# Patient Record
Sex: Female | Born: 1950 | Race: White | Hispanic: No | Marital: Married | Smoking: Former smoker
Health system: Southern US, Community
[De-identification: ages and names within clinical notes are randomized; demographics above are authoritative.]

## PROBLEM LIST (undated history)

## (undated) DIAGNOSIS — K219 Gastro-esophageal reflux disease without esophagitis: Secondary | ICD-10-CM

## (undated) DIAGNOSIS — K649 Unspecified hemorrhoids: Secondary | ICD-10-CM

## (undated) DIAGNOSIS — M1990 Unspecified osteoarthritis, unspecified site: Secondary | ICD-10-CM

## (undated) DIAGNOSIS — R112 Nausea with vomiting, unspecified: Secondary | ICD-10-CM

## (undated) DIAGNOSIS — E7800 Pure hypercholesterolemia, unspecified: Secondary | ICD-10-CM

## (undated) DIAGNOSIS — Z9889 Other specified postprocedural states: Secondary | ICD-10-CM

## (undated) HISTORY — DX: Unspecified hemorrhoids: K64.9

---

## 1980-09-06 HISTORY — PX: AUGMENTATION MAMMAPLASTY: SUR837

## 1981-09-06 HISTORY — PX: TUBAL LIGATION: SHX77

## 2006-09-06 DIAGNOSIS — E7800 Pure hypercholesterolemia, unspecified: Secondary | ICD-10-CM

## 2006-09-06 HISTORY — DX: Pure hypercholesterolemia, unspecified: E78.00

## 2011-09-07 HISTORY — PX: SHOULDER ARTHROSCOPY W/ ROTATOR CUFF REPAIR: SHX2400

## 2012-05-03 ENCOUNTER — Ambulatory Visit: Payer: Self-pay | Admitting: Family Medicine

## 2012-05-22 ENCOUNTER — Ambulatory Visit: Payer: Self-pay | Admitting: Orthopedic Surgery

## 2012-07-03 ENCOUNTER — Ambulatory Visit: Payer: Self-pay | Admitting: Orthopedic Surgery

## 2012-07-03 LAB — URINALYSIS, COMPLETE
Bilirubin,UR: NEGATIVE
Glucose,UR: NEGATIVE mg/dL (ref 0–75)
Ketone: NEGATIVE
Leukocyte Esterase: NEGATIVE
Ph: 7 (ref 4.5–8.0)
Protein: NEGATIVE
RBC,UR: 2 /HPF (ref 0–5)
Squamous Epithelial: 12
WBC UR: 5 /HPF (ref 0–5)

## 2012-07-03 LAB — BASIC METABOLIC PANEL
BUN: 12 mg/dL (ref 7–18)
Calcium, Total: 8.4 mg/dL — ABNORMAL LOW (ref 8.5–10.1)
Co2: 28 mmol/L (ref 21–32)
EGFR (African American): >60
EGFR (Non-African Amer.): >60
Glucose: 103 mg/dL — ABNORMAL HIGH (ref 65–99)
Osmolality: 283 (ref 275–301)
Potassium: 3.7 mmol/L (ref 3.5–5.1)
Sodium: 142 mmol/L (ref 136–145)

## 2012-07-03 LAB — CBC
HGB: 13.6 g/dL (ref 12.0–16.0)
MCH: 30.9 pg (ref 26.0–34.0)
MCHC: 34.7 g/dL (ref 32.0–36.0)
MCV: 89 fL (ref 80–100)
Platelet: 200 10*3/uL (ref 150–440)
RDW: 12.8 % (ref 11.5–14.5)

## 2012-07-03 LAB — APTT: Activated PTT: 28 secs (ref 23.6–35.9)

## 2012-07-03 LAB — PROTIME-INR: INR: 0.9

## 2012-07-05 ENCOUNTER — Ambulatory Visit: Payer: Self-pay | Admitting: Gastroenterology

## 2012-07-11 ENCOUNTER — Ambulatory Visit: Payer: Self-pay | Admitting: Orthopedic Surgery

## 2013-05-04 ENCOUNTER — Ambulatory Visit: Payer: Self-pay | Admitting: Family Medicine

## 2014-07-30 ENCOUNTER — Ambulatory Visit: Payer: Self-pay | Admitting: Family Medicine

## 2014-08-16 ENCOUNTER — Ambulatory Visit: Payer: Self-pay | Admitting: Family Medicine

## 2014-11-08 ENCOUNTER — Ambulatory Visit: Payer: Self-pay | Admitting: Gastroenterology

## 2014-12-30 LAB — SURGICAL PATHOLOGY

## 2015-09-23 ENCOUNTER — Other Ambulatory Visit: Payer: Self-pay | Admitting: Family Medicine

## 2015-09-23 DIAGNOSIS — Z1231 Encounter for screening mammogram for malignant neoplasm of breast: Secondary | ICD-10-CM

## 2015-10-02 ENCOUNTER — Ambulatory Visit
Admission: RE | Admit: 2015-10-02 | Discharge: 2015-10-02 | Disposition: A | Discharge status: Home / Self Care | Payer: BC Managed Care – PPO | Source: Ambulatory Visit | Attending: Family Medicine | Admitting: Family Medicine

## 2015-10-02 DIAGNOSIS — Z1231 Encounter for screening mammogram for malignant neoplasm of breast: Secondary | ICD-10-CM | POA: Insufficient documentation

## 2015-10-02 DIAGNOSIS — Z9882 Breast implant status: Secondary | ICD-10-CM | POA: Diagnosis not present

## 2016-11-01 ENCOUNTER — Other Ambulatory Visit: Payer: Self-pay | Admitting: Family Medicine

## 2016-11-01 DIAGNOSIS — Z1231 Encounter for screening mammogram for malignant neoplasm of breast: Secondary | ICD-10-CM

## 2016-11-08 ENCOUNTER — Ambulatory Visit
Admission: RE | Admit: 2016-11-08 | Discharge: 2016-11-08 | Disposition: A | Discharge status: Home / Self Care | Payer: BC Managed Care – PPO | Source: Ambulatory Visit | Attending: Family Medicine | Admitting: Family Medicine

## 2016-11-08 ENCOUNTER — Other Ambulatory Visit: Payer: Self-pay | Admitting: Family Medicine

## 2016-11-08 DIAGNOSIS — Z1231 Encounter for screening mammogram for malignant neoplasm of breast: Secondary | ICD-10-CM | POA: Insufficient documentation

## 2017-04-15 DIAGNOSIS — M1612 Unilateral primary osteoarthritis, left hip: Secondary | ICD-10-CM | POA: Diagnosis not present

## 2017-04-20 ENCOUNTER — Encounter
Admission: RE | Admit: 2017-04-20 | Discharge: 2017-04-20 | Disposition: A | Discharge status: Home / Self Care | Payer: Medicare HMO | Source: Ambulatory Visit | Attending: Orthopedic Surgery | Admitting: Orthopedic Surgery

## 2017-04-20 DIAGNOSIS — Z01810 Encounter for preprocedural cardiovascular examination: Secondary | ICD-10-CM | POA: Insufficient documentation

## 2017-04-20 DIAGNOSIS — Z01812 Encounter for preprocedural laboratory examination: Secondary | ICD-10-CM | POA: Insufficient documentation

## 2017-04-20 HISTORY — DX: Gastro-esophageal reflux disease without esophagitis: K21.9

## 2017-04-20 HISTORY — DX: Nausea with vomiting, unspecified: R11.2

## 2017-04-20 HISTORY — DX: Other specified postprocedural states: Z98.890

## 2017-04-20 HISTORY — DX: Pure hypercholesterolemia, unspecified: E78.00

## 2017-04-20 HISTORY — DX: Unspecified osteoarthritis, unspecified site: M19.90

## 2017-04-20 LAB — PROTIME-INR
INR: 0.92
PROTHROMBIN TIME: 12.3 s (ref 11.4–15.2)

## 2017-04-20 LAB — URINALYSIS, ROUTINE W REFLEX MICROSCOPIC
BILIRUBIN URINE: NEGATIVE
GLUCOSE, UA: NEGATIVE mg/dL
Hgb urine dipstick: NEGATIVE
KETONES UR: NEGATIVE mg/dL
Leukocytes, UA: NEGATIVE
Nitrite: NEGATIVE
Protein, ur: NEGATIVE mg/dL
Specific Gravity, Urine: 1.011 (ref 1.005–1.030)
pH: 7 (ref 5.0–8.0)

## 2017-04-20 LAB — BASIC METABOLIC PANEL
Anion gap: 11 (ref 5–15)
BUN: 20 mg/dL (ref 6–20)
CALCIUM: 9.6 mg/dL (ref 8.9–10.3)
CO2: 28 mmol/L (ref 22–32)
CREATININE: 0.72 mg/dL (ref 0.44–1.00)
Chloride: 101 mmol/L (ref 101–111)
GFR calc non Af Amer: >60 mL/min (ref >60)
Glucose, Bld: 95 mg/dL (ref 65–99)
Potassium: 3.8 mmol/L (ref 3.5–5.1)
SODIUM: 140 mmol/L (ref 135–145)

## 2017-04-20 LAB — CBC
HCT: 39.4 % (ref 35.0–47.0)
HEMOGLOBIN: 13.6 g/dL (ref 12.0–16.0)
MCH: 30.6 pg (ref 26.0–34.0)
MCHC: 34.5 g/dL (ref 32.0–36.0)
MCV: 88.8 fL (ref 80.0–100.0)
PLATELETS: 184 10*3/uL (ref 150–440)
RBC: 4.44 MIL/uL (ref 3.80–5.20)
RDW: 13.1 % (ref 11.5–14.5)
WBC: 7.7 10*3/uL (ref 3.6–11.0)

## 2017-04-20 LAB — TYPE AND SCREEN
ABO/RH(D): O POS
Antibody Screen: NEGATIVE

## 2017-04-20 LAB — SURGICAL PCR SCREEN
MRSA, PCR: NEGATIVE
Staphylococcus aureus: NEGATIVE

## 2017-04-20 LAB — APTT: aPTT: 27 seconds (ref 24–36)

## 2017-04-20 LAB — SEDIMENTATION RATE: SED RATE: 10 mm/h (ref 0–30)

## 2017-04-20 NOTE — Pre-Procedure Instructions (Signed)
Called Dr Pernell DupreAdams regarding abnormal EKG.  Medical clearance requested.

## 2017-04-20 NOTE — Patient Instructions (Signed)
  Your procedure is scheduled ZO:XWRUEAVon:Tuesday August 28 , 2018. Report to Same Day Surgery. To find out your arrival time please call [phone] between 1PM - 3PM on Monday May 02, 2017.  Remember: Instructions that are not followed completely may result in serious medical risk, up to and including death, or upon the discretion of your surgeon and anesthesiologist your surgery may need to be rescheduled.    _x___ 1. Do not eat food or drink liquids after midnight. No gum chewing or hard candies.     _x___ 2. No Alcohol for 24 hours before or after surgery.   ____ 3. Bring all medications with you on the day of surgery if instructed.    __x__ 4. Notify your doctor if there is any change in your medical condition     (cold, fever, infections).    _____ 5. No smoking 24 hours prior to surgery.     Do not wear jewelry, make-up, hairpins, clips or nail polish.  Do not wear lotions, powders, or perfumes.   Do not shave 48 hours prior to surgery. Men may shave face and neck.  Do not bring valuables to the hospital.    [facility] is not responsible for any belongings or valuables.               Contacts, dentures or bridgework may not be worn into surgery.  Leave your suitcase in the car. After surgery it may be brought to your room.  For patients admitted to the hospital, discharge time is determined by your treatment team.   Patients discharged the day of surgery will not be allowed to drive home.    Please read over the following fact sheets that you were given:   [facility] Pre[facility]  __x__ Take these medicines the morning of surgery with A SIP OF WATER:    1. omeprazole (PRILOSEC) take one dose at bedtime the night prior to surgery and then the am of  surgery.  2. venlafaxine XR (EFFEXOR-XR)    ____ Fleet Enema (as directed)   __x__ Use CHG Soap as directed on instruction sheet  ____ Use inhalers on the day of surgery and bring to hospital day of surgery  ____  Stop metformin 2 days prior to surgery    ____ Take 1/2 of usual insulin dose the night before surgery and none on the morning of surgery.   ____ Stop aspirin on Tuesday April 26, 2017.  ____ Stop Anti-inflammatories such as Advil, Aleve, Ibuprofen, meloxicam (MOBIC) , Motrin, Naproxen, Naprosyn,  Goodies powders or aspirin products. OK to take Tylenol.   ____ Stop supplements until after surgery.    ____ Bring C-Pap to the hospital.

## 2017-04-20 NOTE — Pre-Procedure Instructions (Signed)
EKG / MEDICAL CLEARANCE REQUEST CALLED AND FAXED TO TAYLOR AT DR [facility]. [facility]

## 2017-04-20 NOTE — Pre-Procedure Instructions (Signed)
Spoke with Baird Lyonsasey at Dr. Rosita KeaMenz office regarding joint class and that pt attended joint class last year with her husband when he had his hip replaced Sept 2017. Baird LyonsCasey said pt does not need to go to joint class today.

## 2017-04-20 NOTE — Pre-Procedure Instructions (Signed)
Progress Notes - in this encounter  Table of Contents for Progress Notes Marlena ClipperMenz, Michael Joseph, MD - 04/15/2017 8:15 AM EDT Margaretmary Eddyrowder, Lindsay, CMA - 04/15/2017 8:15 AM EDT   Marlena ClipperMenz, Michael Joseph, MD - 04/15/2017 8:15 AM EDT Formatting of this note may be different from the original.  Chief Complaint  Patient presents with  . Follow-up  L Hip Pain.   History of the Present Illness: Geryl CouncilmanJanice Anfinson is a 66 y.o. female here for follow-up of bilateral hip osteoarthritis that was seen on lumbar x-ray in [place]. She was previously seen by Dedra Skeensodd Mundy, PA and he referred her to physical therapy for her back pain. The patient comes in today to discuss possible total hip replacement. Her left hip is worse.   The patient explains that her pain begins in the middle of her left buttock. She occasionally limps for several days due to pain. She has pain at night that radiates to her knee. Her pain is keeping her from her daily activities and recreational activities.  I have reviewed past medical, surgical, social and family history, and allergies as documented in the EMR.  Past Medical History: Past Medical History:  Diagnosis Date  . External hemorrhoids 11/08/2014  . Hyperlipidemia, unspecified  . Internal hemorrhoids 11/08/2014  . Tubular adenoma of colon, unspecified 11/08/2014   Past Surgical History: Past Surgical History:  Procedure Laterality Date  . Breast augmentation 1981  . COLONOSCOPY 11/08/2014  Tubular adenoma/Repeat 5262yrs/MGR  . right rotator cuff surgery 2013   Past Family History: Family History  Problem Relation Age of Onset  . Alzheimer's disease Mother  . Myocardial Infarction (Heart attack) Father  fatal  . Hyperlipidemia (Elevated cholesterol) Father   Medications: Current Outpatient Prescriptions Ordered in Epic  Medication Sig Dispense Refill  . AMABELZ 1-0.5 mg tablet TAKE 1 TABLET ONCE DAILY 84 tablet 1  . aspirin 81 MG chewable tablet Take 81 mg by mouth once daily.    Marland Kitchen. estradiol-norethindrone (AMABELZ) 1-0.5 mg tablet Take 1 tablet by mouth once daily. 84 tablet 1  . fexofenadine (ALLEGRA) 180 MG tablet Take 180 mg by mouth once daily.  . meloxicam (MOBIC) 7.5 MG tablet Take 7.5 mg by mouth 2 (two) times daily. 0  . multivitamin tablet Take 1 tablet by mouth once daily.  Marland Kitchen. omeprazole (PRILOSEC) 40 MG DR capsule Take 1 capsule (40 mg total) by mouth once daily. 90 capsule 1  . peg-electrolyte (NULYTELY) solution Take as directed for colon prep. 4000 mL 0  . pravastatin (PRAVACHOL) 40 MG tablet Take 1 tablet (40 mg total) by mouth nightly. 90 tablet 1  . tiZANidine (ZANAFLEX) 4 MG tablet Take 1 tablet (4 mg total) by mouth nightly as needed. 90 tablet 1  . venlafaxine (EFFEXOR-XR) 37.5 MG XR capsule Take 1 capsule (37.5 mg total) by mouth once daily. 90 capsule 1  . VITAMIN B COMPLEX (B COMPLEX 1 ORAL) Take 1 tablet by mouth once daily.   No current Epic-ordered facility-administered medications on file.   Allergies: No Known Allergies   Body mass index is 28.72 kg/m.  Review of Systems: A comprehensive 14 point ROS was performed, reviewed, and the pertinent orthopaedic findings are documented in the HPI.  Vitals:  04/15/17 0817  BP: 112/70   General Physical Examination:  General/Constitutional: No apparent distress: well-nourished and well developed. Eyes: Pupils equal, round with synchronous movement. Lungs: Clear to auscultation HEENT: Normal Vascular: No edema, swelling or tenderness, except as noted in detailed exam. Cardiac: Heart  rate and rhythm is regular. Integumentary: No impressive skin lesions present, except as noted in detailed exam. Neuro/Psych: Normal mood and affect, oriented to person, place and time.  Musculoskeletal Examination: On exam, the patient has 5 degrees of internal rotation on the right with external rotation 40 degrees. The left hip has approximately 5 degrees of internal rotation with 30 degrees of external  rotation with pain.  Radiographs: AP and lateral x-rays of the left hip were ordered and personally reviewed today. These show progression from a prior x-ray from 4 months ago with cyst formation in the acetabulum. There is bone loss. Subchondral cyst formation is appreciated in the femoral head. She has complete loss of joint space and slight collapse of the femoral head.  X-ray Impression: Significant progression of osteoarthritis of the left hip with subluxation and complete loss of joint space with extensive osteophytes medially  Assessment: ICD-10-CM ICD-9-CM  1. Primary osteoarthritis of left hip M16.12 715.15   Plan:  We discussed her condition and treatment options today. I recommend she have total hip replacement on the left. She has undergone physical therapy. She has taken anti-inflammatories. She has significant progression at this time. Her husband had anterior approach hip replacement last year and did well. She hopes to do just as well.  Scribe Attestation: I, Georgia Lopes, am acting as scribe for [employer], MD

## 2017-04-21 LAB — URINE CULTURE: Culture: NO GROWTH

## 2017-04-26 ENCOUNTER — Encounter: Payer: Self-pay | Admitting: Nurse Practitioner

## 2017-04-26 ENCOUNTER — Ambulatory Visit (INDEPENDENT_AMBULATORY_CARE_PROVIDER_SITE_OTHER): Payer: Medicare HMO | Admitting: Nurse Practitioner

## 2017-04-26 VITALS — BP 128/78 | HR 84 | Ht 62.0 in | Wt 154.8 lb

## 2017-04-26 DIAGNOSIS — M159 Polyosteoarthritis, unspecified: Secondary | ICD-10-CM | POA: Diagnosis not present

## 2017-04-26 DIAGNOSIS — E782 Mixed hyperlipidemia: Secondary | ICD-10-CM | POA: Diagnosis not present

## 2017-04-26 DIAGNOSIS — Z01810 Encounter for preprocedural cardiovascular examination: Secondary | ICD-10-CM

## 2017-04-26 NOTE — Progress Notes (Signed)
Cardiology Clinic Note   Patient Name: Briana Morris Date of Encounter: 04/26/2017  Primary Care Provider:  [name], MD Primary Cardiologist:  New  Patient Profile    66 y/o ? with a h/o HL, GERD, and OA, who presents for preop eval pending L total hip arthroplasty.  Past Medical History    Past Medical History:  Diagnosis Date  . Arthritis   . GERD (gastroesophageal reflux disease)   . Hemorrhoids    a. Internal/External.  . Hypercholesteremia 2008  . PONV (postoperative nausea and vomiting)    nausea no vomiting  . Tubular adenoma of colon    a. 11/2014 s/p resection.   Past Surgical History:  Procedure Laterality Date  . AUGMENTATION MAMMAPLASTY Bilateral 1982  . SHOULDER ARTHROSCOPY W/ ROTATOR CUFF REPAIR Right 2013    Allergies  No Known Allergies  History of Present Illness    66 y/o ? w/o prior cardiac hx.  She does have a h/o HL, OA, GERD, hemorrhoids, colon adenoma s/p resection, and tobacco abuse, quitting in 2001.  She lives locally with her husband and is generally pretty active.  They walk regularly and up until March of this year, they were going to the gym for both aerobic activities along with some light wt training.  Over the past 6 months, she has had progressive left hip and lower back pain.  She was referred to ortho and initially referred for PT for lumbar DDD and bilat hip DJD.  Unfortunately, left hip symptoms progressed and activity tolerance declined 2/2 left hip pain.  F/u imaging has shown progression of left hip OA and decision has been made to pursue L THA - tentatively scheduled for 8/28.   From a cardiac standpoint, pt has no prior h/o chest pain or DOE.  As noted, she was previously very active prior to progressive hip pain earlier this year and even now, she is able to walk at a slow pace without cardiovascular limitations, though left hip pain causes her to limp and may cause her to have to rest.  She denies palpitations, pnd,  orthopnea, n, v, dizziness, syncope, edema, weight gain, or early satiety.   Home Medications    Prior to Admission medications   Medication Sig Start Date End Date Taking? Authorizing Provider  aspirin EC 81 MG tablet Take 81 mg by mouth daily.   Yes [provider]  estradiol-norethindrone (AMABELZ) 1-0.5 MG tablet Take 1 tablet by mouth daily.   Yes [provider]  Ibuprofen-Diphenhydramine Cit (ADVIL PM) 200-38 MG TABS Take 1 tablet by mouth at bedtime.   Yes [provider]  meloxicam (MOBIC) 7.5 MG tablet Take 7.5 mg by mouth 2 (two) times daily.   Yes [provider]  Multiple Vitamin (MULTIVITAMIN WITH MINERALS) TABS tablet Take 1 tablet by mouth daily.   Yes [provider]  omeprazole (PRILOSEC) 40 MG capsule Take 40 mg by mouth at bedtime.   Yes [provider]  pravastatin (PRAVACHOL) 40 MG tablet Take 40 mg by mouth at bedtime.   Yes [provider]  Tetrahydroz-Glyc-Hyprom-PEG (VISINE MAXIMUM REDNESS RELIEF OP) Place 1 drop into both eyes daily as needed (red eyes).   Yes [provider]  tiZANidine (ZANAFLEX) 4 MG tablet Take 4 mg by mouth at bedtime as needed for muscle spasms.   Yes [provider]  venlafaxine XR (EFFEXOR-XR) 37.5 MG 24 hr capsule Take 37.5 mg by mouth daily.   Yes [provider]  Family History    Family History  Problem Relation Age of Onset  . Alzheimer's disease Mother   . Hyperlipidemia Father   . Heart attack Father   . Heart attack Brother        MI @ age 33  . Cancer Brother        throat- resolved  . Breast cancer Neg Hx     Social History    Social History   Social History  . Marital status: Divorced    Spouse name: N/A  . Number of children: N/A  . Years of education: N/A   Occupational History  . Not on file.   Social History Main Topics  . Smoking status: Former Smoker    Packs/day: 1.00    Years: 28.00    Types: Cigarettes     Quit date: 2001  . Smokeless tobacco: Never Used  . Alcohol use 2.4 oz/week    4 Glasses of wine per week     Comment: 1-2 glasses of wine 3-4 nights/wk  . Drug use: No  . Sexual activity: Not on file   Other Topics Concern  . Not on file   Social History Narrative   Lives in [place] with husband.  Active.  Was exercising regularly until hip pain worsened 11/2016.     Review of Systems    General:  No chills, fever, night sweats or weight changes.  Cardiovascular:  No chest pain, dyspnea on exertion, edema, orthopnea, palpitations, paroxysmal nocturnal dyspnea. Dermatological: No rash, lesions/masses Respiratory: No cough, dyspnea Urologic: No hematuria, dysuria Abdominal:   No nausea, vomiting, diarrhea, bright red blood per rectum, melena, or hematemesis Neurologic:  No visual changes, wkns, changes in mental status. MSK: +++ left hip pain All other systems reviewed and are otherwise negative except as noted above.  Physical Exam    VS:  BP 128/78 (BP Location: Right Arm, Patient Position: Sitting, Cuff Size: Normal)   Pulse 84   Ht 5\' 2"  (1.575 m)   Wt 154 lb 12 oz (70.2 kg)   SpO2 96%   BMI 28.30 kg/m  , BMI Body mass index is 28.3 kg/m. GEN: Well nourished, well developed, in no acute distress.  HEENT: normal.  Neck: Supple, no JVD, carotid bruits, or masses. Cardiac: RRR, no murmurs, rubs, or gallops. No clubbing, cyanosis, edema.  Radials/DP/PT 2+ and equal bilaterally.  Respiratory:  Respirations regular and unlabored, clear to auscultation bilaterally. GI: Soft, nontender, nondistended, BS + x 4. MS: no deformity or atrophy. Skin: warm and dry, no rash. Neuro:  Strength and sensation are intact. Psych: Normal affect.  Accessory Clinical Findings    ECG - 04/20/2017: RSR, 68, leftward axis, no acute st/t changes.  Assessment & Plan   1.  Preoperative cardiovascular examination:  Pt w/ a prior h/o HL on statin therapy and remote tobacco abuse.  No prior  cardiac history.  She was previously very active and exercising regularly up until ~ 11/2016 when she began to experience progressive left hip pain.  She continues to walk regularly with her husband and has no prior h/o chest pain or dyspnea on exertion.  RCRI risk of a major perioperative cardiac event is 0.4%.  In that setting, she may proceed to surgery without prior cardiovascular risk stratification or ischemic evaluation.  Cont statin therapy throughout the perioperative period and feel free to contact our team if she develops any postoperative cardiology needs.  2.  HL:  Followed by PCP.  LDL 160  in Feb. 2018.  On statin therapy and would recommend continuation throughout perioperative period.  3.  GERD:  Asymptomatic and well controlled on PPI therapy.  4.  OA:  Pending L THA.  See #1.   5. Disposition:  F/u with cardiology prn.  Nicolasa Ducking, NP 04/26/2017, 3:26 PM

## 2017-04-26 NOTE — Patient Instructions (Signed)
Follow-Up: Your physician recommends that you schedule a follow-up appointment as needed.   It was a pleasure seeing you today here in the office. Please do not hesitate to give us a call back if you have any further questions. [phone]  Pamela A. RN, BSN    

## 2017-04-29 NOTE — Pre-Procedure Instructions (Signed)
Briana Morris  04/26/2017 2:30 PM  Office Visit  MRN:  [id number]  Description: Female DOB: 12-Mar-1951 Provider: Ok Anis, NP Department: [facility]  Diagnoses    Codes Comments  Preop cardiovascular exam - Primary Z01.810   Mixed hyperlipidemia  E78.2   Osteoarthritis of multiple joints, unspecified osteoarthritis type  M15.9        Referring Provider   [name], MD      Reason for Visit   other pre op clearance, surgery on 8/28 per Dr. Burnett Sheng. Pt Reviewed meds with pt verbally.  Reason for Visit History    Vital Signs - Last Recorded  Most recent update: 04/26/2017 2:41 PM by Deatra Ina L, LPN  BP  409/81 (BP Location: Right Arm, Patient Position: Sitting, Cuff Size: Normal)     Pulse  84     Ht  5\' 2"  (1.575 m)     Wt  154 lb 12 oz (70.2 kg)     SpO2  96%      BMI  28.30 kg/m      Vitals History  Encounter Notes   Progress Notes by Ok Anis, NP at 04/26/2017 2:30 PM  Version 1 of 1  Author: Ok Anis, NP Service: Cardiology Author Type: Nurse Practitioner  Filed: 04/26/2017 3:45 PM Encounter Date: 04/26/2017 Status: Signed  Editor: Ok Anis, NP (Nurse Practitioner)      Cardiology Clinic Note   Patient Name: Briana Morris Date of Encounter: 04/26/2017  Primary Care Provider:  [name], MD Primary Cardiologist:  New  Patient Profile    66 y/o ? with a h/o HL, GERD, and OA, who presents for preop eval pending L total hip arthroplasty.  Past Medical History        Past Medical History:  Diagnosis Date  . Arthritis   . GERD (gastroesophageal reflux disease)   . Hemorrhoids    a. Internal/External.  . Hypercholesteremia 2008  . PONV (postoperative nausea and vomiting)    nausea no vomiting  . Tubular adenoma of colon    a. 11/2014 s/p resection.        Past Surgical History:  Procedure Laterality Date  . AUGMENTATION MAMMAPLASTY  Bilateral 1982  . SHOULDER ARTHROSCOPY W/ ROTATOR CUFF REPAIR Right 2013    Allergies  No Known Allergies  History of Present Illness    66 y/o ? w/o prior cardiac hx.  She does have a h/o HL, OA, GERD, hemorrhoids, colon adenoma s/p resection, and tobacco abuse, quitting in 2001.  She lives locally with her husband and is generally pretty active.  They walk regularly and up until March of this year, they were going to the gym for both aerobic activities along with some light wt training.  Over the past 6 months, she has had progressive left hip and lower back pain.  She was referred to ortho and initially referred for PT for lumbar DDD and bilat hip DJD.  Unfortunately, left hip symptoms progressed and activity tolerance declined 2/2 left hip pain.  F/u imaging has shown progression of left hip OA and decision has been made to pursue L THA - tentatively scheduled for 8/28.   From a cardiac standpoint, pt has no prior h/o chest pain or DOE.  As noted, she was previously very active prior to progressive hip pain earlier this year and even now, she is able to walk at a slow pace without cardiovascular limitations, though left hip pain causes her to  limp and may cause her to have to rest.  She denies palpitations, pnd, orthopnea, n, v, dizziness, syncope, edema, weight gain, or early satiety.   Home Medications           Prior to Admission medications   Medication Sig Start Date End Date Taking? Authorizing Provider  aspirin EC 81 MG tablet Take 81 mg by mouth daily.   Yes [provider]  estradiol-norethindrone (AMABELZ) 1-0.5 MG tablet Take 1 tablet by mouth daily.   Yes [provider]  Ibuprofen-Diphenhydramine Cit (ADVIL PM) 200-38 MG TABS Take 1 tablet by mouth at bedtime.   Yes [provider]  meloxicam (MOBIC) 7.5 MG tablet Take 7.5 mg by mouth 2 (two) times daily.   Yes [provider]  Multiple Vitamin (MULTIVITAMIN WITH MINERALS) TABS  tablet Take 1 tablet by mouth daily.   Yes [provider]  omeprazole (PRILOSEC) 40 MG capsule Take 40 mg by mouth at bedtime.   Yes [provider]  pravastatin (PRAVACHOL) 40 MG tablet Take 40 mg by mouth at bedtime.   Yes [provider]  Tetrahydroz-Glyc-Hyprom-PEG (VISINE MAXIMUM REDNESS RELIEF OP) Place 1 drop into both eyes daily as needed (red eyes).   Yes [provider]  tiZANidine (ZANAFLEX) 4 MG tablet Take 4 mg by mouth at bedtime as needed for muscle spasms.   Yes [provider]  venlafaxine XR (EFFEXOR-XR) 37.5 MG 24 hr capsule Take 37.5 mg by mouth daily.   Yes [provider]    Family History         Family History  Problem Relation Age of Onset  . Alzheimer's disease Mother   . Hyperlipidemia Father   . Heart attack Father   . Heart attack Brother        MI @ age 27  . Cancer Brother        throat- resolved  . Breast cancer Neg Hx     Social History    Social History        Social History  . Marital status: Divorced    Spouse name: N/A  . Number of children: N/A  . Years of education: N/A      Occupational History  . Not on file.         Social History Main Topics  . Smoking status: Former Smoker    Packs/day: 1.00    Years: 28.00    Types: Cigarettes    Quit date: 2001  . Smokeless tobacco: Never Used  . Alcohol use 2.4 oz/week    4 Glasses of wine per week     Comment: 1-2 glasses of wine 3-4 nights/wk  . Drug use: No  . Sexual activity: Not on file       Other Topics Concern  . Not on file      Social History Narrative   Lives in [place] with husband.  Active.  Was exercising regularly until hip pain worsened 11/2016.     Review of Systems    General:  No chills, fever, night sweats or weight changes.  Cardiovascular:  No chest pain, dyspnea on exertion, edema, orthopnea, palpitations, paroxysmal nocturnal  dyspnea. Dermatological: No rash, lesions/masses Respiratory: No cough, dyspnea Urologic: No hematuria, dysuria Abdominal:   No nausea, vomiting, diarrhea, bright red blood per rectum, melena, or hematemesis Neurologic:  No visual changes, wkns, changes in mental status. MSK: +++ left hip pain All other systems reviewed and are otherwise negative except as noted  above.  Physical Exam    VS:  BP 128/78 (BP Location: Right Arm, Patient Position: Sitting, Cuff Size: Normal)   Pulse 84   Ht 5\' 2"  (1.575 m)   Wt 154 lb 12 oz (70.2 kg)   SpO2 96%   BMI 28.30 kg/m  , BMI Body mass index is 28.3 kg/m. GEN: Well nourished, well developed, in no acute distress.  HEENT: normal.  Neck: Supple, no JVD, carotid bruits, or masses. Cardiac: RRR, no murmurs, rubs, or gallops. No clubbing, cyanosis, edema.  Radials/DP/PT 2+ and equal bilaterally.  Respiratory:  Respirations regular and unlabored, clear to auscultation bilaterally. GI: Soft, nontender, nondistended, BS + x 4. MS: no deformity or atrophy. Skin: warm and dry, no rash. Neuro:  Strength and sensation are intact. Psych: Normal affect.  Accessory Clinical Findings    ECG - 04/20/2017: RSR, 68, leftward axis, no acute st/t changes.  Assessment & Plan   1.  Preoperative cardiovascular examination:  Pt w/ a prior h/o HL on statin therapy and remote tobacco abuse.  No prior cardiac history.  She was previously very active and exercising regularly up until ~ 11/2016 when she began to experience progressive left hip pain.  She continues to walk regularly with her husband and has no prior h/o chest pain or dyspnea on exertion.  RCRI risk of a major perioperative cardiac event is 0.4%.  In that setting, she may proceed to surgery without prior cardiovascular risk stratification or ischemic evaluation.  Cont statin therapy throughout the perioperative period and feel free to contact our team if she develops any postoperative cardiology  needs.  2.  HL:  Followed by PCP.  LDL 160 in Feb. 2018.  On statin therapy and would recommend continuation throughout perioperative period.  3.  GERD:  Asymptomatic and well controlled on PPI therapy.  4.  OA:  Pending L THA.  See #1.   5. Disposition:  F/u with cardiology prn.  Nicolasa Ducking, NP 04/26/2017, 3:26 PM      Progress Notes by Ok Anis, NP at 04/26/2017 2:30 PM  Version 1 of 1  Author: Ok Anis, NP Service: Cardiology Author Type: Nurse Practitioner  Filed: 04/26/2017 3:45 PM Encounter Date: 04/26/2017 Status: Signed  Editor: Ok Anis, NP (Nurse Practitioner)         Patient Instructions by Bryna Colander, RN at 04/26/2017 2:30 PM  Version 1 of 1  Author: Bryna Colander, RN Service: (none) Author Type: Registered Nurse  Filed: 04/26/2017 3:07 PM Encounter Date: 04/26/2017 Status: Signed  Editor: Bryna Colander, RN (Registered Nurse)    Follow-Up: Your physician recommends that you schedule a follow-up appointment as needed.    It was a pleasure seeing you today here in the office. Please do not hesitate to give [place] a call back if you have any further questions. [phone]  [address] Cellar RN, BSN        Follow-up and Disposition   Return if symptoms worsen or fail to improve.  Routing History  Follow-up and Disposition History

## 2017-05-03 ENCOUNTER — Inpatient Hospital Stay: Payer: Medicare HMO

## 2017-05-03 ENCOUNTER — Inpatient Hospital Stay: Payer: Medicare HMO | Admitting: Certified Registered Nurse Anesthetist

## 2017-05-03 ENCOUNTER — Encounter: Payer: Self-pay | Admitting: *Deleted

## 2017-05-03 ENCOUNTER — Inpatient Hospital Stay
Admission: RE | Admit: 2017-05-03 | Discharge: 2017-05-05 | DRG: 470 | Disposition: A | Discharge status: Home Health | Payer: Medicare HMO | Source: Ambulatory Visit | Attending: Orthopedic Surgery | Admitting: Orthopedic Surgery

## 2017-05-03 ENCOUNTER — Encounter
Admission: RE | Disposition: A | Discharge status: Home Health | Payer: Self-pay | Source: Ambulatory Visit | Attending: Orthopedic Surgery

## 2017-05-03 DIAGNOSIS — K219 Gastro-esophageal reflux disease without esophagitis: Secondary | ICD-10-CM | POA: Diagnosis present

## 2017-05-03 DIAGNOSIS — Z87891 Personal history of nicotine dependence: Secondary | ICD-10-CM

## 2017-05-03 DIAGNOSIS — M1612 Unilateral primary osteoarthritis, left hip: Secondary | ICD-10-CM | POA: Diagnosis not present

## 2017-05-03 DIAGNOSIS — E7800 Pure hypercholesterolemia, unspecified: Secondary | ICD-10-CM | POA: Diagnosis not present

## 2017-05-03 DIAGNOSIS — M1712 Unilateral primary osteoarthritis, left knee: Secondary | ICD-10-CM | POA: Diagnosis not present

## 2017-05-03 DIAGNOSIS — Z471 Aftercare following joint replacement surgery: Secondary | ICD-10-CM | POA: Diagnosis not present

## 2017-05-03 DIAGNOSIS — Z96642 Presence of left artificial hip joint: Secondary | ICD-10-CM | POA: Diagnosis not present

## 2017-05-03 DIAGNOSIS — G8918 Other acute postprocedural pain: Secondary | ICD-10-CM

## 2017-05-03 DIAGNOSIS — Z419 Encounter for procedure for purposes other than remedying health state, unspecified: Secondary | ICD-10-CM

## 2017-05-03 HISTORY — PX: TOTAL HIP ARTHROPLASTY: SHX124

## 2017-05-03 LAB — CBC
HCT: 33.5 % — ABNORMAL LOW (ref 35.0–47.0)
Hemoglobin: 11.6 g/dL — ABNORMAL LOW (ref 12.0–16.0)
MCH: 30.6 pg (ref 26.0–34.0)
MCHC: 34.5 g/dL (ref 32.0–36.0)
MCV: 88.8 fL (ref 80.0–100.0)
PLATELETS: 169 10*3/uL (ref 150–440)
RBC: 3.78 MIL/uL — ABNORMAL LOW (ref 3.80–5.20)
RDW: 12.7 % (ref 11.5–14.5)
WBC: 17.6 10*3/uL — AB (ref 3.6–11.0)

## 2017-05-03 LAB — CREATININE, SERUM: Creatinine, Ser: 0.6 mg/dL (ref 0.44–1.00)

## 2017-05-03 LAB — ABO/RH: ABO/RH(D): O POS

## 2017-05-03 SURGERY — ARTHROPLASTY, HIP, TOTAL, ANTERIOR APPROACH
Anesthesia: Spinal | Laterality: Left | Wound class: Clean

## 2017-05-03 MED ORDER — PRAVASTATIN SODIUM 20 MG PO TABS
40.0000 mg | ORAL_TABLET | Freq: Every day | ORAL | Status: DC
  Administered 2017-05-03 – 2017-05-04 (×2): 40 mg via ORAL
  Filled 2017-05-03 (×2): qty 2

## 2017-05-03 MED ORDER — PHENYLEPHRINE HCL 10 MG/ML IJ SOLN
INTRAMUSCULAR | Status: DC | PRN
  Administered 2017-05-03 (×2): 100 ug via INTRAVENOUS
  Administered 2017-05-03: 200 ug via INTRAVENOUS
  Administered 2017-05-03 (×4): 100 ug via INTRAVENOUS
  Administered 2017-05-03: 200 ug via INTRAVENOUS

## 2017-05-03 MED ORDER — PROPOFOL 10 MG/ML IV BOLUS
INTRAVENOUS | Status: DC | PRN
  Administered 2017-05-03: 20 mg via INTRAVENOUS

## 2017-05-03 MED ORDER — FENTANYL CITRATE (PF) 100 MCG/2ML IJ SOLN: 25.0000 ug | INTRAMUSCULAR | Status: DC | PRN

## 2017-05-03 MED ORDER — ACETAMINOPHEN 650 MG RE SUPP: 650.0000 mg | Freq: Four times a day (QID) | RECTAL | Status: DC | PRN

## 2017-05-03 MED ORDER — ADULT MULTIVITAMIN W/MINERALS CH
1.0000 | ORAL_TABLET | Freq: Every day | ORAL | Status: DC
  Administered 2017-05-05: 1 via ORAL
  Filled 2017-05-03 (×2): qty 1

## 2017-05-03 MED ORDER — MIDAZOLAM HCL 5 MG/5ML IJ SOLN
INTRAMUSCULAR | Status: DC | PRN
  Administered 2017-05-03: 2 mg via INTRAVENOUS

## 2017-05-03 MED ORDER — ASPIRIN EC 81 MG PO TBEC
81.0000 mg | DELAYED_RELEASE_TABLET | Freq: Every day | ORAL | Status: DC
  Administered 2017-05-05: 81 mg via ORAL
  Filled 2017-05-03 (×2): qty 1

## 2017-05-03 MED ORDER — METHOCARBAMOL 1000 MG/10ML IJ SOLN
500.0000 mg | Freq: Four times a day (QID) | INTRAMUSCULAR | Status: DC | PRN
  Filled 2017-05-03: qty 5

## 2017-05-03 MED ORDER — ONDANSETRON HCL 4 MG PO TABS: 4.0000 mg | ORAL_TABLET | Freq: Four times a day (QID) | ORAL | Status: DC | PRN

## 2017-05-03 MED ORDER — METOCLOPRAMIDE HCL 5 MG/ML IJ SOLN
5.0000 mg | Freq: Three times a day (TID) | INTRAMUSCULAR | Status: DC | PRN
  Administered 2017-05-04: 10 mg via INTRAVENOUS
  Filled 2017-05-03: qty 2

## 2017-05-03 MED ORDER — PROPOFOL 500 MG/50ML IV EMUL
INTRAVENOUS | Status: DC | PRN
  Administered 2017-05-03: 50 ug/kg/min via INTRAVENOUS

## 2017-05-03 MED ORDER — MAGNESIUM CITRATE PO SOLN
1.0000 | Freq: Once | ORAL | Status: DC | PRN
  Filled 2017-05-03: qty 296

## 2017-05-03 MED ORDER — PROPOFOL 500 MG/50ML IV EMUL
INTRAVENOUS | Status: AC
  Filled 2017-05-03: qty 50

## 2017-05-03 MED ORDER — TIZANIDINE HCL 4 MG PO TABS
4.0000 mg | ORAL_TABLET | Freq: Every evening | ORAL | Status: DC | PRN
  Filled 2017-05-03: qty 1

## 2017-05-03 MED ORDER — BUPIVACAINE-EPINEPHRINE (PF) 0.25% -1:200000 IJ SOLN
INTRAMUSCULAR | Status: AC
  Filled 2017-05-03: qty 30

## 2017-05-03 MED ORDER — ALUM & MAG HYDROXIDE-SIMETH 200-200-20 MG/5ML PO SUSP: 30.0000 mL | ORAL | Status: DC | PRN

## 2017-05-03 MED ORDER — SODIUM CHLORIDE 0.9 % IV BOLUS (SEPSIS): 500.0000 mL | Freq: Once | INTRAVENOUS | Status: DC

## 2017-05-03 MED ORDER — MAGNESIUM HYDROXIDE 400 MG/5ML PO SUSP
30.0000 mL | Freq: Every day | ORAL | Status: DC | PRN
  Administered 2017-05-04: 30 mL via ORAL
  Filled 2017-05-03: qty 30

## 2017-05-03 MED ORDER — OXYCODONE HCL 5 MG PO TABS
5.0000 mg | ORAL_TABLET | ORAL | Status: DC | PRN
  Administered 2017-05-03 (×2): 5 mg via ORAL
  Administered 2017-05-04: 10 mg via ORAL
  Administered 2017-05-04: 5 mg via ORAL
  Filled 2017-05-03: qty 2
  Filled 2017-05-03 (×3): qty 1

## 2017-05-03 MED ORDER — PROMETHAZINE HCL 25 MG/ML IJ SOLN: 6.2500 mg | INTRAMUSCULAR | Status: DC | PRN

## 2017-05-03 MED ORDER — NAPHAZOLINE-GLYCERIN 0.012-0.2 % OP SOLN
1.0000 [drp] | Freq: Four times a day (QID) | OPHTHALMIC | Status: DC | PRN
  Filled 2017-05-03: qty 15

## 2017-05-03 MED ORDER — CEFAZOLIN SODIUM-DEXTROSE 2-4 GM/100ML-% IV SOLN
2.0000 g | Freq: Once | INTRAVENOUS | Status: AC
  Administered 2017-05-03: 2 g via INTRAVENOUS

## 2017-05-03 MED ORDER — KETOROLAC TROMETHAMINE 15 MG/ML IJ SOLN
15.0000 mg | Freq: Once | INTRAMUSCULAR | Status: AC
  Administered 2017-05-03: 15 mg via INTRAVENOUS
  Filled 2017-05-03: qty 1

## 2017-05-03 MED ORDER — LACTATED RINGERS IV SOLN
INTRAVENOUS | Status: DC
  Administered 2017-05-03 (×2): via INTRAVENOUS

## 2017-05-03 MED ORDER — VENLAFAXINE HCL ER 37.5 MG PO CP24
37.5000 mg | ORAL_CAPSULE | Freq: Every day | ORAL | Status: DC
  Administered 2017-05-04 – 2017-05-05 (×2): 37.5 mg via ORAL
  Filled 2017-05-03 (×2): qty 1

## 2017-05-03 MED ORDER — SODIUM CHLORIDE 0.9 % IV SOLN
INTRAVENOUS | Status: DC | PRN
  Administered 2017-05-03: 1000 mg via INTRAVENOUS

## 2017-05-03 MED ORDER — ZOLPIDEM TARTRATE 5 MG PO TABS: 5.0000 mg | ORAL_TABLET | Freq: Every evening | ORAL | Status: DC | PRN

## 2017-05-03 MED ORDER — SODIUM CHLORIDE 0.9 % IV SOLN
INTRAVENOUS | Status: DC
  Administered 2017-05-03 – 2017-05-04 (×2): via INTRAVENOUS

## 2017-05-03 MED ORDER — TRANEXAMIC ACID 1000 MG/10ML IV SOLN
1000.0000 mg | INTRAVENOUS | Status: DC
  Filled 2017-05-03: qty 10

## 2017-05-03 MED ORDER — PROPOFOL 10 MG/ML IV BOLUS
INTRAVENOUS | Status: AC
  Filled 2017-05-03: qty 20

## 2017-05-03 MED ORDER — METHOCARBAMOL 500 MG PO TABS
500.0000 mg | ORAL_TABLET | Freq: Four times a day (QID) | ORAL | Status: DC | PRN
  Administered 2017-05-04 – 2017-05-05 (×3): 500 mg via ORAL
  Filled 2017-05-03 (×3): qty 1

## 2017-05-03 MED ORDER — BUPIVACAINE HCL (PF) 0.5 % IJ SOLN
INTRAMUSCULAR | Status: DC | PRN
  Administered 2017-05-03: 3 mL

## 2017-05-03 MED ORDER — OXYCODONE HCL 5 MG/5ML PO SOLN
5.0000 mg | Freq: Once | ORAL | Status: DC | PRN
Start: 2017-05-03 — End: 2017-05-03

## 2017-05-03 MED ORDER — DIPHENHYDRAMINE HCL 12.5 MG/5ML PO ELIX: 12.5000 mg | ORAL_SOLUTION | ORAL | Status: DC | PRN

## 2017-05-03 MED ORDER — MORPHINE SULFATE (PF) 2 MG/ML IV SOLN
2.0000 mg | INTRAVENOUS | Status: DC | PRN
  Administered 2017-05-03 – 2017-05-04 (×3): 2 mg via INTRAVENOUS
  Filled 2017-05-03 (×2): qty 1

## 2017-05-03 MED ORDER — ESTRADIOL-NORETHINDRONE ACET 1-0.5 MG PO TABS: 1.0000 | ORAL_TABLET | Freq: Every day | ORAL | Status: DC

## 2017-05-03 MED ORDER — METOCLOPRAMIDE HCL 10 MG PO TABS: 5.0000 mg | ORAL_TABLET | Freq: Three times a day (TID) | ORAL | Status: DC | PRN

## 2017-05-03 MED ORDER — PHENOL 1.4 % MT LIQD
1.0000 | OROMUCOSAL | Status: DC | PRN
  Filled 2017-05-03: qty 177

## 2017-05-03 MED ORDER — FENTANYL CITRATE (PF) 100 MCG/2ML IJ SOLN
INTRAMUSCULAR | Status: DC | PRN
Start: 2017-05-03 — End: 2017-05-03
  Administered 2017-05-03 (×2): 50 ug via INTRAVENOUS

## 2017-05-03 MED ORDER — OXYCODONE HCL 5 MG PO TABS: 5.0000 mg | ORAL_TABLET | Freq: Once | ORAL | Status: DC | PRN

## 2017-05-03 MED ORDER — ONDANSETRON HCL 4 MG/2ML IJ SOLN
4.0000 mg | Freq: Four times a day (QID) | INTRAMUSCULAR | Status: DC | PRN
  Administered 2017-05-03 – 2017-05-04 (×2): 4 mg via INTRAVENOUS
  Filled 2017-05-03 (×2): qty 2

## 2017-05-03 MED ORDER — FENTANYL CITRATE (PF) 100 MCG/2ML IJ SOLN
INTRAMUSCULAR | Status: AC
  Filled 2017-05-03: qty 2

## 2017-05-03 MED ORDER — BISACODYL 10 MG RE SUPP: 10.0000 mg | Freq: Every day | RECTAL | Status: DC | PRN

## 2017-05-03 MED ORDER — DOCUSATE SODIUM 100 MG PO CAPS
100.0000 mg | ORAL_CAPSULE | Freq: Two times a day (BID) | ORAL | Status: DC
Start: 2017-05-03 — End: 2017-05-05
  Administered 2017-05-03 – 2017-05-05 (×3): 100 mg via ORAL
  Filled 2017-05-03 (×4): qty 1

## 2017-05-03 MED ORDER — BUPIVACAINE-EPINEPHRINE 0.25% -1:200000 IJ SOLN
INTRAMUSCULAR | Status: DC | PRN
  Administered 2017-05-03: 30 mL

## 2017-05-03 MED ORDER — PANTOPRAZOLE SODIUM 40 MG PO TBEC
80.0000 mg | DELAYED_RELEASE_TABLET | Freq: Every day | ORAL | Status: DC
  Administered 2017-05-03 – 2017-05-04 (×2): 80 mg via ORAL
  Filled 2017-05-03 (×2): qty 2

## 2017-05-03 MED ORDER — MEPERIDINE HCL 50 MG/ML IJ SOLN: 6.2500 mg | INTRAMUSCULAR | Status: DC | PRN

## 2017-05-03 MED ORDER — ENOXAPARIN SODIUM 40 MG/0.4ML ~~LOC~~ SOLN
40.0000 mg | SUBCUTANEOUS | Status: DC
  Administered 2017-05-04 – 2017-05-05 (×2): 40 mg via SUBCUTANEOUS
  Filled 2017-05-03 (×2): qty 0.4

## 2017-05-03 MED ORDER — MIDAZOLAM HCL 2 MG/2ML IJ SOLN
INTRAMUSCULAR | Status: AC
  Filled 2017-05-03: qty 2

## 2017-05-03 MED ORDER — CEFAZOLIN SODIUM-DEXTROSE 2-4 GM/100ML-% IV SOLN
INTRAVENOUS | Status: AC
  Filled 2017-05-03: qty 100

## 2017-05-03 MED ORDER — TETRAHYDROZ-GLYC-HYPROM-PEG 0.05-0.2-0.36-1 % OP SOLN: 1.0000 [drp] | Freq: Every day | OPHTHALMIC | Status: DC | PRN

## 2017-05-03 MED ORDER — ACETAMINOPHEN 325 MG PO TABS
650.0000 mg | ORAL_TABLET | Freq: Four times a day (QID) | ORAL | Status: DC | PRN
  Administered 2017-05-04 – 2017-05-05 (×3): 650 mg via ORAL
  Filled 2017-05-03 (×3): qty 2

## 2017-05-03 MED ORDER — MENTHOL 3 MG MT LOZG
1.0000 | LOZENGE | OROMUCOSAL | Status: DC | PRN
  Filled 2017-05-03: qty 9

## 2017-05-03 MED ORDER — CEFAZOLIN SODIUM-DEXTROSE 2-4 GM/100ML-% IV SOLN
2.0000 g | Freq: Four times a day (QID) | INTRAVENOUS | Status: AC
  Administered 2017-05-03 – 2017-05-04 (×3): 2 g via INTRAVENOUS
  Filled 2017-05-03 (×4): qty 100

## 2017-05-03 MED ORDER — NEOMYCIN-POLYMYXIN B GU 40-200000 IR SOLN
Status: AC
  Filled 2017-05-03: qty 4

## 2017-05-03 MED ORDER — NEOMYCIN-POLYMYXIN B GU 40-200000 IR SOLN
Status: DC | PRN
  Administered 2017-05-03: 2 mL

## 2017-05-03 SURGICAL SUPPLY — 51 items
BLADE SAW SAG 18.5X105 (BLADE) ×2 IMPLANT
BNDG COHESIVE 6X5 TAN STRL LF (GAUZE/BANDAGES/DRESSINGS) ×6 IMPLANT
CANISTER SUCT 1200ML W/VALVE (MISCELLANEOUS) ×2 IMPLANT
CAPT HIP TOTAL HALF DEPUY (Capitated) ×2 IMPLANT
CAPT HIP TOTAL HALF MEDACTA (Capitated) ×2 IMPLANT
CATH FOL LEG HOLDER (MISCELLANEOUS) ×2 IMPLANT
CATH TRAY METER 16FR LF (MISCELLANEOUS) ×2 IMPLANT
CHLORAPREP W/TINT 26ML (MISCELLANEOUS) ×2 IMPLANT
DRAPE C-ARM XRAY 36X54 (DRAPES) ×2 IMPLANT
DRAPE INCISE IOBAN 66X60 STRL (DRAPES) IMPLANT
DRAPE POUCH INSTRU U-SHP 10X18 (DRAPES) ×2 IMPLANT
DRAPE SHEET LG 3/4 BI-LAMINATE (DRAPES) ×6 IMPLANT
DRAPE TABLE BACK 80X90 (DRAPES) ×2 IMPLANT
DRESSING SURGICEL FIBRLLR 1X2 (HEMOSTASIS) ×2 IMPLANT
DRSG OPSITE POSTOP 4X8 (GAUZE/BANDAGES/DRESSINGS) ×4 IMPLANT
DRSG SURGICEL FIBRILLAR 1X2 (HEMOSTASIS) ×4
ELECT BLADE 6.5 EXT (BLADE) ×2 IMPLANT
ELECT REM PT RETURN 9FT ADLT (ELECTROSURGICAL) ×2
ELECTRODE REM PT RTRN 9FT ADLT (ELECTROSURGICAL) ×1 IMPLANT
EVACUATOR 1/8 PVC DRAIN (DRAIN) IMPLANT
GLOVE BIOGEL PI IND STRL 9 (GLOVE) ×1 IMPLANT
GLOVE BIOGEL PI INDICATOR 9 (GLOVE) ×1
GLOVE SURG SYN 9.0  PF PI (GLOVE) ×2
GLOVE SURG SYN 9.0 PF PI (GLOVE) ×2 IMPLANT
GOWN SRG 2XL LVL 4 RGLN SLV (GOWNS) ×1 IMPLANT
GOWN STRL NON-REIN 2XL LVL4 (GOWNS) ×1
GOWN STRL REUS W/ TWL LRG LVL3 (GOWN DISPOSABLE) ×1 IMPLANT
GOWN STRL REUS W/TWL LRG LVL3 (GOWN DISPOSABLE) ×1
HOOD PEEL AWAY FLYTE STAYCOOL (MISCELLANEOUS) ×2 IMPLANT
KIT PREVENA INCISION MGT 13 (CANNISTER) ×2 IMPLANT
MAT BLUE FLOOR 46X72 FLO (MISCELLANEOUS) ×2 IMPLANT
NDL SAFETY 18GX1.5 (NEEDLE) ×2 IMPLANT
NEEDLE SPNL 18GX3.5 QUINCKE PK (NEEDLE) ×2 IMPLANT
NS IRRIG 1000ML POUR BTL (IV SOLUTION) ×2 IMPLANT
PACK HIP COMPR (MISCELLANEOUS) ×2 IMPLANT
SOL PREP PVP 2OZ (MISCELLANEOUS) ×2
SOLUTION PREP PVP 2OZ (MISCELLANEOUS) ×1 IMPLANT
SPONGE DRAIN TRACH 4X4 STRL 2S (GAUZE/BANDAGES/DRESSINGS) ×2 IMPLANT
STAPLER SKIN PROX 35W (STAPLE) ×2 IMPLANT
STRAP SAFETY BODY (MISCELLANEOUS) ×2 IMPLANT
SUT DVC 2 QUILL PDO  T11 36X36 (SUTURE) ×1
SUT DVC 2 QUILL PDO T11 36X36 (SUTURE) ×1 IMPLANT
SUT SILK 0 (SUTURE) ×1
SUT SILK 0 30XBRD TIE 6 (SUTURE) ×1 IMPLANT
SUT V-LOC 90 ABS DVC 3-0 CL (SUTURE) ×2 IMPLANT
SUT VIC AB 1 CT1 36 (SUTURE) ×2 IMPLANT
SYR 20CC LL (SYRINGE) ×2 IMPLANT
SYR 30ML LL (SYRINGE) ×2 IMPLANT
TAPE MICROFOAM 4IN (TAPE) ×2 IMPLANT
TOWEL OR 17X26 4PK STRL BLUE (TOWEL DISPOSABLE) ×2 IMPLANT
WND VAC CANISTER 500ML (MISCELLANEOUS) ×2 IMPLANT

## 2017-05-03 NOTE — Progress Notes (Signed)
Patient is c/o pain, IV morphine has no effect. Patient has ben nauseous with a BP of 99/65.  Notified Dr. Rosita Kea. Orders of a 500cc bolus and 15mg  Toradol IV x1

## 2017-05-03 NOTE — [place] FL2 (Signed)
[place] MEDICAID FL2 LEVEL OF CARE SCREENING TOOL     IDENTIFICATION  Patient Name: Briana Morris Birthdate: August 05, 1951 Sex: female Admission Date (Current Location): 05/03/2017  [place] and [place] Number:  Chiropodist and Address:  [facility], [address], [place], [place] [address]      Provider Number: [id number]  Attending Physician Name and Address:  Kennedy Bucker, MD  Relative Name and Phone Number:       Current Level of Care: Hospital Recommended Level of Care: Skilled Nursing Facility Prior Approval Number:    Date Approved/Denied:   PASRR Number:  ([id number])  Discharge Plan: SNF    Current Diagnoses: Patient Active Problem List   Diagnosis Date Noted  . Primary osteoarthritis of left hip 05/03/2017    Orientation RESPIRATION BLADDER Height & Weight     Self, Time, Situation, Place  Normal Continent Weight: 151 lb (68.5 kg) Height:  5\' 2"  (157.5 cm)  BEHAVIORAL SYMPTOMS/MOOD NEUROLOGICAL BOWEL NUTRITION STATUS   (none )  (none) Continent Diet (Diet: Clear Liquid to be [facility]. )  AMBULATORY STATUS COMMUNICATION OF NEEDS Skin   Extensive Assist Verbally Surgical wounds, Wound Vac (Incision: Left Hip, Provena Wound Vac. )                       Personal Care Assistance Level of Assistance  Bathing, Feeding, Dressing Bathing Assistance: Limited assistance Feeding assistance: Independent Dressing Assistance: Limited assistance     Functional Limitations Info  Sight, Hearing, Speech Sight Info: Impaired Hearing Info: Adequate Speech Info: Adequate    SPECIAL CARE FACTORS FREQUENCY  PT (By licensed PT), OT (By licensed OT)     PT Frequency:  (5) OT Frequency:  (5)            Contractures      Additional Factors Info  Code Status, Allergies Code Status Info:  (Full Code. ) Allergies Info:  (No Known Allergies. )           Current Medications (05/03/2017):  This is the current  hospital active medication list Current Facility-Administered Medications  Medication Dose Route Frequency Provider Last Rate Last Dose  . 0.9 %  sodium chloride infusion   Intravenous Continuous Kennedy Bucker, MD      . acetaminophen (TYLENOL) tablet 650 mg  650 mg Oral Q6H PRN Kennedy Bucker, MD       Or  . acetaminophen (TYLENOL) suppository 650 mg  650 mg Rectal Q6H PRN Kennedy Bucker, MD      . alum & mag hydroxide-simeth (MAALOX/MYLANTA) 200-200-20 MG/5ML suspension 30 mL  30 mL Oral Q4H PRN Kennedy Bucker, MD      . aspirin EC tablet 81 mg  81 mg Oral Daily Kennedy Bucker, MD      . bisacodyl (DULCOLAX) suppository 10 mg  10 mg Rectal Daily PRN Kennedy Bucker, MD      . ceFAZolin (ANCEF) IVPB 2g/100 mL premix  2 g Intravenous Q6H Kennedy Bucker, MD      . diphenhydrAMINE (BENADRYL) 12.5 MG/5ML elixir 12.5-25 mg  12.5-25 mg Oral Q4H PRN Kennedy Bucker, MD      . docusate sodium (COLACE) capsule 100 mg  100 mg Oral BID Kennedy Bucker, MD      . Melene Muller ON 05/04/2017] enoxaparin (LOVENOX) injection 40 mg  40 mg Subcutaneous Q24H Kennedy Bucker, MD      . estradiol-norethindrone (ACTIVELLA) 1-0.5 MG per tablet 1 tablet  1 tablet Oral  Daily Kennedy Bucker, MD      . magnesium citrate solution 1 Bottle  1 Bottle Oral Once PRN Kennedy Bucker, MD      . magnesium hydroxide (MILK OF MAGNESIA) suspension 30 mL  30 mL Oral Daily PRN Kennedy Bucker, MD      . menthol-cetylpyridinium (CEPACOL) lozenge 3 mg  1 lozenge Oral PRN Kennedy Bucker, MD       Or  . phenol (CHLORASEPTIC) mouth spray 1 spray  1 spray Mouth/Throat PRN Kennedy Bucker, MD      . methocarbamol (ROBAXIN) tablet 500 mg  500 mg Oral Q6H PRN Kennedy Bucker, MD       Or  . methocarbamol (ROBAXIN) 500 mg in dextrose 5 % 50 mL IVPB  500 mg Intravenous Q6H PRN Kennedy Bucker, MD      . metoCLOPramide (REGLAN) tablet 5-10 mg  5-10 mg Oral Q8H PRN Kennedy Bucker, MD       Or  . metoCLOPramide (REGLAN) injection 5-10 mg  5-10 mg Intravenous Q8H PRN Kennedy Bucker, MD      . morphine 2 MG/ML injection 2 mg  2 mg Intravenous Q2H PRN Kennedy Bucker, MD      . multivitamin with minerals tablet 1 tablet  1 tablet Oral Daily Kennedy Bucker, MD      . naphazoline-glycerin (CLEAR EYES) ophth solution 1 drop  1 drop Both Eyes QID PRN Kennedy Bucker, MD      . ondansetron [facility]) tablet 4 mg  4 mg Oral Q6H PRN Kennedy Bucker, MD       Or  . ondansetron [facility]) injection 4 mg  4 mg Intravenous Q6H PRN Kennedy Bucker, MD      . oxyCODONE (Oxy IR/ROXICODONE) immediate release tablet 5-10 mg  5-10 mg Oral Q3H PRN Kennedy Bucker, MD      . Melene Muller ON 05/04/2017] pantoprazole (PROTONIX) EC tablet 80 mg  80 mg Oral Daily Kennedy Bucker, MD      . pravastatin (PRAVACHOL) tablet 40 mg  40 mg Oral QHS Kennedy Bucker, MD      . tiZANidine (ZANAFLEX) tablet 4 mg  4 mg Oral QHS PRN Kennedy Bucker, MD      . Melene Muller ON 05/04/2017] venlafaxine XR (EFFEXOR-XR) 24 hr capsule 37.5 mg  37.5 mg Oral Daily Kennedy Bucker, MD      . zolpidem (AMBIEN) tablet 5 mg  5 mg Oral QHS PRN Kennedy Bucker, MD         Discharge Medications: Please see discharge summary for a list of discharge medications.  Relevant Imaging Results:  Relevant Lab Results:   Additional Information  (SSN: [id number])  Lillian Tigges, Darleen Crocker, LCSW

## 2017-05-03 NOTE — Anesthesia Procedure Notes (Signed)
Spinal  Patient location during procedure: OR Start time: 05/03/2017 12:47 PM End time: 05/03/2017 12:58 PM Staffing Anesthesiologist: Randa Lynn AMY Resident/CRNA: Demetrius Charity Performed: resident/CRNA  Preanesthetic Checklist Completed: patient identified, site marked, surgical consent, pre-op evaluation, timeout performed, IV checked, risks and benefits discussed and monitors and equipment checked Spinal Block Patient position: sitting Prep: Betadine Patient monitoring: heart rate, continuous pulse ox, blood pressure and cardiac monitor Approach: midline Location: L4-5 Injection technique: single-shot Needle Needle type: [place] and Introducer  Needle gauge: 25 G Needle length: 9 cm Additional Notes Negative paresthesia. Negative blood return. Positive free-flowing CSF. Expiration date of kit checked and confirmed. Patient tolerated procedure well, without complications.

## 2017-05-03 NOTE — Anesthesia Post-op Follow-up Note (Signed)
Anesthesia QCDR form completed.        

## 2017-05-03 NOTE — Op Note (Signed)
05/03/2017  2:20 PM  PATIENT:  Briana Morris  66 y.o. female  PRE-OPERATIVE DIAGNOSIS:  PRIMARY OSTEOARTHRITIS OF LEFT HIP  POST-OPERATIVE DIAGNOSIS:  PRIMARY OSTEOARTHRITIS OF LEFT HIP  PROCEDURE:  Procedure(s): TOTAL HIP ARTHROPLASTY ANTERIOR APPROACH (Left)  SURGEON: Leitha Schuller, MD  ASSISTANTS: None  ANESTHESIA:   spinal  EBL:  Total I/O In: 1400 [I.V.:1400] Out: 700 [Urine:100; Blood:600]  BLOOD ADMINISTERED:none  DRAINS: none   LOCAL MEDICATIONS USED:  MARCAINE     SPECIMEN:  Source of Specimen:  Left femoral head  DISPOSITION OF SPECIMEN:  PATHOLOGY  COUNTS:  YES  TOURNIQUET:  * No tourniquets in log *  IMPLANTS: Medacta Mpact cup DM with liner and [place]w Actis 5 stem, 28 mm +1.5 Biolox ceramic head  DICTATION: .Dragon Dictation   The patient was brought to the operating room and after spinal anesthesia was obtained patient was placed on the operative table with the ipsilateral foot into the Medacta attachment, contralateral leg on a well-padded table. C-arm was brought in and preop template x-ray taken. After prepping and draping in usual sterile fashion appropriate patient identification and timeout procedures were completed. Anterior approach to the hip was obtained and centered over the greater trochanter and TFL muscle. The subcutaneous tissue was incised hemostasis being achieved by electrocautery. TFL fascia was incised and the muscle retracted laterally deep retractor placed. The lateral femoral circumflex vessels were identified and ligated. The anterior capsule was exposed and a capsulotomy performed. The neck was identified and a femoral neck cut carried out with a saw. The head was removed without difficulty and showed sclerotic femoral head and acetabulum. Reaming was carried out to 48 mm and a 50 mm cup trial gave appropriate tightness to the acetabular component a 50 DM cup was impacted into position. The leg was then externally rotated and  ischiofemoral and pubofemoral releases carried out. The femur was sequentially broached to a size 5, size 5 with standard neck and 1+ head trials were placed and the final components chosen. The 5 standard stem was inserted along with a +1.5 ceramic 28 mm head and 50 mm liner. The hip was reduced and was stable the wound was thoroughly irrigated. The deep fascia was closed using a heavy Quill after infiltration of 30 cc of quarter percent Sensorcaine with epinephrine. 2-0 Vicryl to close the skin with skin staples followed by an incisional wound VAC  PLAN OF CARE: Admit to inpatient

## 2017-05-03 NOTE — Anesthesia Preprocedure Evaluation (Signed)
Anesthesia Evaluation  Patient identified by MRN, date of birth, ID band Patient awake    Reviewed: Allergy & Precautions, NPO status , Patient's Chart, lab work & pertinent test results  History of Anesthesia Complications (+) PONV and history of anesthetic complications  Airway Mallampati: II  TM Distance: >3 FB Neck ROM: Full    Dental no notable dental hx.    Pulmonary neg sleep apnea, neg COPD, former smoker,    breath sounds clear to auscultation- rhonchi (-) wheezing      Cardiovascular Exercise Tolerance: Good (-) hypertension(-) CAD, (-) Past MI and (-) Cardiac Stents  Rhythm:Regular Rate:Normal - Systolic murmurs and - Diastolic murmurs    Neuro/Psych negative neurological ROS  negative psych ROS   GI/Hepatic Neg liver ROS, GERD  ,  Endo/Other  negative endocrine ROSneg diabetes  Renal/GU negative Renal ROS     Musculoskeletal  (+) Arthritis ,   Abdominal (+) - obese,   Peds  Hematology negative hematology ROS (+)   Anesthesia Other Findings Past Medical History: No date: Arthritis No date: GERD (gastroesophageal reflux disease) No date: Hemorrhoids     Comment:  a. Internal/External. 2008: Hypercholesteremia No date: PONV (postoperative nausea and vomiting)     Comment:  nausea no vomiting No date: Tubular adenoma of colon     Comment:  a. 11/2014 s/p resection.   Reproductive/Obstetrics                             Anesthesia Physical Anesthesia Plan  ASA: II  Anesthesia Plan: Spinal   Post-op Pain Management:    Induction:   PONV Risk Score and Plan: 3 and Ondansetron, Dexamethasone, Midazolam and Propofol infusion  Airway Management Planned: Natural Airway  Additional Equipment:   Intra-op Plan:   Post-operative Plan:   Informed Consent: I have reviewed the patients History and Physical, chart, labs and discussed the procedure including the risks,  benefits and alternatives for the proposed anesthesia with the patient or authorized representative who has indicated his/her understanding and acceptance.   Dental advisory given  Plan Discussed with: Anesthesiologist and CRNA  Anesthesia Plan Comments:         Lab Results  Component Value Date   WBC 7.7 04/20/2017   HGB 13.6 04/20/2017   HCT 39.4 04/20/2017   MCV 88.8 04/20/2017   PLT 184 04/20/2017    Anesthesia Quick Evaluation

## 2017-05-03 NOTE — Anesthesia Procedure Notes (Signed)
Performed by: Manley Fason Pre-anesthesia Checklist: Patient identified, Emergency Drugs available, Suction available, Patient being monitored and Timeout performed Oxygen Delivery Method: Simple face mask       

## 2017-05-03 NOTE — H&P (Signed)
Reviewed paper H+P, will be scanned into chart. No changes noted.  

## 2017-05-03 NOTE — Transfer of Care (Signed)
Immediate Anesthesia Transfer of Care Note  Patient: Briana Morris  Procedure(s) Performed: Procedure(s): TOTAL HIP ARTHROPLASTY ANTERIOR APPROACH (Left)  Patient Location: PACU  Anesthesia Type:Spinal  Level of Consciousness: awake, alert  and oriented  Airway & Oxygen Therapy: Patient Spontanous Breathing and Patient connected to face mask oxygen  Post-op Assessment: Report given to RN and Post -op Vital signs reviewed and stable  Post vital signs: Reviewed and stable  Last Vitals:  Vitals:   05/03/17 1121  Resp: 18    Last Pain:  Vitals:   05/03/17 1121  TempSrc: Oral  PainSc: 6          Complications: No apparent anesthesia complications

## 2017-05-04 ENCOUNTER — Encounter: Payer: Self-pay | Admitting: Orthopedic Surgery

## 2017-05-04 MED ORDER — SCOPOLAMINE 1 MG/3DAYS TD PT72
1.0000 | MEDICATED_PATCH | TRANSDERMAL | Status: DC
  Administered 2017-05-04: 1.5 mg via TRANSDERMAL
  Filled 2017-05-04: qty 1

## 2017-05-04 NOTE — Evaluation (Signed)
Physical Therapy Evaluation Patient Details Name: Briana Morris MRN: [id number] DOB: June 12, 1951 Today's Date: 05/04/2017   History of Present Illness  Pt is a 66 y.o. female s/p L THA anterior approach 05/03/17.  PMH includes PONV, shoulder arthroscopy with RCR, and colon adenoma s/p resection.  Clinical Impression  Prior to hospital admission, pt was independent.  Pt lives with her husband on main level of home with stairs to enter.  Currently pt is min assist supine to sit (assist for L LE d/t pain but otherwise moving well).  Further mobility deferred d/t pt feeling dizzy, nauseas, and like she was going to pass out (BP 139/72, HR 85, and O2 98% on room air) so pt assisted back to bed.  Within a minute of laying down pt started to throw up in emesis bag (nursing notified).  Anticipate once pt is feeling better, she will progress well with physical therapy.  Pt would benefit from skilled PT to address noted impairments and functional limitations (see below for any additional details).  Upon hospital discharge, recommend pt discharge to home with HHPT and support of family.    Follow Up Recommendations Home health PT;Supervision for mobility/OOB    Equipment Recommendations  Rolling walker with 5" wheels    Recommendations for Other Services       Precautions / Restrictions Precautions Precautions: Anterior Hip;Fall Precaution Comments: Wound vac Restrictions Weight Bearing Restrictions: Yes LLE Weight Bearing: Weight bearing as tolerated      Mobility  Bed Mobility Overal bed mobility: Needs Assistance Bed Mobility: Supine to Sit;Sit to Supine     Supine to sit: Min assist;HOB elevated Sit to supine: Min assist;HOB elevated   General bed mobility comments: assist for L LE in/out of bed (pt otherwise does well scooting in bed)  Transfers                 General transfer comment: Deferred d/t pt feeling dizzy, nauseas, and feeling like she was going to pass out  (BP 139/72) so pt assisted back to bed  Ambulation/Gait             General Gait Details: Deferred d/t pt feeling dizzy, nauseas, and feeling like she was going to pass out (BP 139/72) so pt assisted back to bed  Stairs            Wheelchair Mobility    Modified Rankin (Stroke Patients Only)       Balance Overall balance assessment: Needs assistance Sitting-balance support: Bilateral upper extremity supported;Feet supported Sitting balance-Leahy Scale: Fair Sitting balance - Comments: static sitting   Standing balance support:  (Deferred (see mobility above for details))                                 Pertinent Vitals/Pain Pain Assessment: 0-10 Pain Score: 2  Pain Location: L hip Pain Descriptors / Indicators: Sore Pain Intervention(s): Limited activity within patient's tolerance;Monitored during session;Premedicated before session;Repositioned     Home Living Family/patient expects to be discharged to:: Private residence Living Arrangements: Spouse/significant other Available Help at Discharge: Family Type of Home: House Home Access: Stairs to enter Entrance Stairs-Rails: Left Entrance Stairs-Number of Steps: 4-5 Home Layout: Multi-level;Able to live on main level with bedroom/bathroom (stays on main level) Home Equipment: Walker - 2 wheels;Cane - single point;Bedside commode      Prior Function Level of Independence: Independent  Comments: Pt denies any falls in past 6 months.     Hand Dominance        Extremity/Trunk Assessment   Upper Extremity Assessment Upper Extremity Assessment: Overall WFL for tasks assessed    Lower Extremity Assessment Lower Extremity Assessment: LLE deficits/detail (R LE WFL) LLE Deficits / Details: hip flexion at least 2+/5 (limited d/t pain); knee flexion/extension at least 3+/5; DF at least 4/5 LLE: Unable to fully assess due to pain    Cervical / Trunk Assessment Cervical / Trunk  Assessment: Normal  Communication   Communication: No difficulties  Cognition Arousal/Alertness: Awake/alert Behavior During Therapy: WFL for tasks assessed/performed Overall Cognitive Status: Within Functional Limits for tasks assessed                                        General Comments General comments (skin integrity, edema, etc.): Wound vac intact.  Nursing cleared pt for participation in physical therapy.  Pt agreeable to limited PT session.    Exercises Total Joint Exercises Ankle Circles/Pumps: AROM;Strengthening;Both;10 reps;Supine Quad Sets: AROM;Strengthening;Both;10 reps;Supine Gluteal Sets: AROM;Strengthening;Both;10 reps;Supine Towel Squeeze: AROM;Strengthening;Both;10 reps;Supine Short Arc Quad: AAROM;Strengthening;Left;10 reps;Supine Heel Slides: AAROM;Strengthening;Left;10 reps;Supine Hip ABduction/ADduction: AAROM;Strengthening;Left;10 reps;Supine  Vc's required for above exercise technique.   Assessment/Plan    PT Assessment Patient needs continued PT services  PT Problem List Decreased strength;Decreased activity tolerance;Decreased balance;Decreased mobility;Decreased knowledge of use of DME;Decreased knowledge of precautions;Pain       PT Treatment Interventions DME instruction;Gait training;Stair training;Functional mobility training;Therapeutic activities;Therapeutic exercise;Balance training;Patient/family education    PT Goals (Current goals can be found in the Care Plan section)  Acute Rehab PT Goals Patient Stated Goal: to go home PT Goal Formulation: With patient Time For Goal Achievement: 05/18/17 Potential to Achieve Goals: Fair    Frequency BID   Barriers to discharge        Co-evaluation               AM-PAC PT "6 Clicks" Daily Activity  Outcome Measure Difficulty turning over in bed (including adjusting bedclothes, sheets and blankets)?: A Little Difficulty moving from lying on back to sitting on the side of  the bed? : Unable Difficulty sitting down on and standing up from a chair with arms (e.g., wheelchair, bedside commode, etc,.)?: Unable Help needed moving to and from a bed to chair (including a wheelchair)?: A Little Help needed walking in hospital room?: A Lot Help needed climbing 3-5 steps with a railing? : A Lot 6 Click Score: 12    End of Session Equipment Utilized During Treatment: Gait belt Activity Tolerance: Other (comment) (Limited d/t nausea, dizziness) Patient left: in bed;with call bell/phone within reach;with bed alarm set;with family/visitor present;with SCD's reapplied (B heels elevated via towel rolls) Nurse Communication: Mobility status;Precautions;Weight bearing status (Pt with nausea, multiple emesis end of session, dizziness, BP) PT Visit Diagnosis: Other abnormalities of gait and mobility (R26.89);Muscle weakness (generalized) (M62.81);Pain Pain - Right/Left: Left Pain - part of body: Hip    Time: 1610-9604 PT Time Calculation (min) (ACUTE ONLY): 30 min   Charges:   PT Evaluation $PT Eval Low Complexity: 1 Low PT Treatments $Therapeutic Exercise: 8-22 mins   PT G Codes:   PT G-Codes **NOT FOR INPATIENT CLASS** Functional Assessment Tool Used: AM-PAC 6 Clicks Basic Mobility Functional Limitation: Mobility: Walking and moving around Mobility: Walking and Moving Around Current Status (V4098): At least 60 percent  but less than 80 percent impaired, limited or restricted Mobility: Walking and Moving Around Goal Status [phone]): At least 1 percent but less than 20 percent impaired, limited or restricted    [facility], PT 05/04/17, 1:06 PM [phone]

## 2017-05-04 NOTE — Anesthesia Postprocedure Evaluation (Signed)
Anesthesia Post Note  Patient: Briana Morris  Procedure(s) Performed: Procedure(s) (LRB): TOTAL HIP ARTHROPLASTY ANTERIOR APPROACH (Left)  Patient location during evaluation: Nursing Unit Anesthesia Type: Spinal Level of consciousness: awake, awake and alert and oriented Pain management: pain level controlled Vital Signs Assessment: post-procedure vital signs reviewed and stable Respiratory status: spontaneous breathing Cardiovascular status: blood pressure returned to baseline Postop Assessment: no headache, no backache and adequate PO intake Anesthetic complications: no Comments: Patient c/o PONV.  Encouraged to try scopolamine patch for next surgery       Last Vitals:  Vitals:   05/04/17 0004 05/04/17 0443  BP: 116/66 120/60  Pulse: 96 93  Resp: 19 19  Temp: 36.8 C 36.7 C  SpO2: 95% 97%    Last Pain:  Vitals:   05/04/17 0530  TempSrc:   PainSc: 2                  Corbyn Wildey Lawerance CruelStarr

## 2017-05-04 NOTE — Care Management Note (Signed)
Case Management Note  Patient Details  Name: Briana Morris MRN: [id number] Date of Birth: 10/07/1950  Subjective/Objective:  POD # 1 left THA. Met with patient at bedside to discuss discharge planning. Patient lives at home with her husband who will be her caretaker. She has a walker. Offered choice of home health agencies. [facility]erral to [facility] for [facility]PT. Pharmacy: [facility] [phone]. Called Lovenox 40 mg # 14 no  Refills.                   Action/Plan: Lovenox called in. [facility] for HHPT. No DME needs.   Expected Discharge Date:  05/05/17               Expected Discharge Plan:  [place]  In-House Referral:     Discharge planning Services  CM Consult  Post Acute Care Choice:  Home Health Choice offered to:  Patient  DME Arranged:    DME Agency:     HH Arranged:  PT [place]:  [place]  Status of Service:  In process, will continue to follow  If discussed at Long Length of Stay Meetings, dates discussed:    Additional Comments:  Briana Mango, RN 05/04/2017, 2:48 PM

## 2017-05-04 NOTE — Progress Notes (Signed)
   Subjective: 1 Day Post-Op Procedure(s) (LRB): TOTAL HIP ARTHROPLASTY ANTERIOR APPROACH (Left) Patient reports pain as 6 on 0-10 scale.   Patient is well, and has had no acute complaints or problems Denies any CP, SOB, ABD pain. We will start therapy today.  Plan is to go Home after hospital stay.  Objective: Vital signs in last 24 hours: Temp:  [97.5 F (36.4 C)-98.5 F (36.9 C)] 98.1 F (36.7 C) (08/29 0443) Pulse Rate:  [68-104] 93 (08/29 0443) Resp:  [10-20] 19 (08/29 0443) BP: (84-136)/(60-80) 120/60 (08/29 0443) SpO2:  [95 %-100 %] 97 % (08/29 0443) Weight:  [68 kg (150 lb)-68.5 kg (151 lb)] 68.5 kg (151 lb) (08/28 1600)  Intake/Output from previous day: 08/28 0701 - 08/29 0700 In: 2391.7 [I.V.:2391.7] Out: 1890 [Urine:1340; Blood:550] Intake/Output this shift: No intake/output data recorded.   Recent Labs  05/03/17 2145  HGB 11.6*    Recent Labs  05/03/17 2145  WBC 17.6*  RBC 3.78*  HCT 33.5*  PLT 169    Recent Labs  05/03/17 2145  CREATININE 0.60   No results for input(s): LABPT, INR in the last 72 hours.  EXAM General - Patient is Alert, Appropriate and Oriented Extremity - Neurovascular intact Sensation intact distally Intact pulses distally Dorsiflexion/Plantar flexion intact No cellulitis present Compartment soft Dressing - dressing C/D/I, no drainage and wound vac intact Motor Function - intact, moving foot and toes well on exam.   Past Medical History:  Diagnosis Date  . Arthritis   . GERD (gastroesophageal reflux disease)   . Hemorrhoids    a. Internal/External.  . Hypercholesteremia 2008  . PONV (postoperative nausea and vomiting)    nausea no vomiting  . Tubular adenoma of colon    a. 11/2014 s/p resection.    Assessment/Plan:   1 Day Post-Op Procedure(s) (LRB): TOTAL HIP ARTHROPLASTY ANTERIOR APPROACH (Left) Active Problems:   Primary osteoarthritis of left hip  Estimated body mass index is 27.62 kg/m as calculated  from the following:   Height as of this encounter: 5\' 2"  (1.575 m).   Weight as of this encounter: 68.5 kg (151 lb). [facility] diet Up with therapy  Needs BM Pain well controlled VSS Labs stable, will recheck in the am CM to assist with discharge   DVT Prophylaxis - Lovenox, Foot Pumps and TED hose Weight-Bearing as tolerated to left leg   T. [place]ston Neighborhris Lanette Ell, PA-C [facility] 05/04/2017, 7:10 AM

## 2017-05-04 NOTE — Progress Notes (Addendum)
Pt became nauseous and vomited after breakfast this morning. Gave Reglan @1012  to ease nausea/vominting. Seemed to ease it for a while, but returned. Dr. Lenna Gilford scopolamine patch for nausea. Patch placed behind L ear. No episodes of emesis since.

## 2017-05-04 NOTE — Progress Notes (Signed)
Clinical Social Worker (CSW) received SNF consult. PT is recommending home health. RN case manager aware of above. Please reconsult if future social work needs arise. CSW signing off.   Spruha Weight, LCSW [phone] 

## 2017-05-04 NOTE — Progress Notes (Signed)
Physical Therapy Treatment Patient Details Name: Briana Morris MRN: [id number] DOB: 01/31/1951 Today's Date: 05/04/2017    History of Present Illness Pt is a 66 y.o. female s/p L THA anterior approach 05/03/17.  PMH includes PONV, shoulder arthroscopy with RCR, and colon adenoma s/p resection.    PT Comments    Pt reporting feeling better this afternoon and was able to progress to ambulating 30 feet with RW CGA.  Pt requesting to toilet after ambulation and after getting off of BSC pt reporting not feeling well but resolved once pt was sitting in recliner.  Will continue to progress pt with strengthening and ambulation distance next session per pt tolerance.    Follow Up Recommendations  Home health PT     Equipment Recommendations   (youth sized RW)    Recommendations for Other Services       Precautions / Restrictions Precautions Precautions: Anterior Hip;Fall Precaution Comments: Wound vac Restrictions Weight Bearing Restrictions: Yes LLE Weight Bearing: Weight bearing as tolerated    Mobility  Bed Mobility Overal bed mobility: Needs Assistance Bed Mobility: Supine to Sit     Supine to sit: Min assist;HOB elevated   General bed mobility comments: assist for L LE  Transfers Overall transfer level: Needs assistance Equipment used: Rolling walker (2 wheeled) Transfers: Sit to/from [employer] Sit to Stand: Min guard Stand pivot transfers: Min guard (stand step turn with RW chair to/from [facility])       General transfer comment: strong stand with RW; vc's for hand placement; x1 trial from bed, 2 trials from recliner, and 1 trial from [facility]  Ambulation/Gait Ambulation/Gait assistance: Min guard Ambulation Distance (Feet): 30 Feet Assistive device: Rolling walker (2 wheeled)   Gait velocity: decreased   General Gait Details: decreased stance time L LE; vc's to increase UE support through [employer]     Modified Rankin (Stroke Patients Only)       Balance Overall balance assessment: Needs assistance Sitting-balance support: No upper extremity supported;Feet supported Sitting balance-Leahy Scale: Good Sitting balance - Comments: sitting reaching within BOS   Standing balance support: Single extremity supported Standing balance-Leahy Scale: Poor Standing balance comment: Requires single UE support for balance with toileting hygiene                            Cognition Arousal/Alertness: Awake/alert Behavior During Therapy: WFL for tasks assessed/performed Overall Cognitive Status: Within Functional Limits for tasks assessed                                        Exercises     General Comments General comments (skin integrity, edema, etc.): Wound vac intact.  Pt agreeable to PT session.      Pertinent Vitals/Pain Pain Assessment: 0-10 Pain Score: 2  Pain Location: L hip Pain Descriptors / Indicators: Sore Pain Intervention(s): Limited activity within patient's tolerance;Monitored during session;Repositioned;Ice applied  Vitals (HR and O2 on room air) stable and WFL throughout treatment session.    Home Living Family/patient expects to be discharged to:: Private residence Living Arrangements: Spouse/significant other Available Help at Discharge: Family Type of Home: House Home Access: Stairs to enter Entrance Stairs-Rails: Left Home Layout: Multi-level;Able to live on main level with bedroom/bathroom (stays on main level) Home Equipment:  Walker - 2 wheels;Cane - single point;Bedside commode      Prior Function Level of Independence: Independent      Comments: Pt denies any falls in past 6 months.   PT Goals (current goals can now be found in the care plan section) Acute Rehab PT Goals Patient Stated Goal: to go home PT Goal Formulation: With patient Time For Goal Achievement: 05/18/17 Potential to Achieve Goals: Good Progress  towards PT goals: Progressing toward goals    Frequency    BID      PT Plan Current plan remains appropriate    Co-evaluation              AM-PAC PT "6 Clicks" Daily Activity  Outcome Measure  Difficulty turning over in bed (including adjusting bedclothes, sheets and blankets)?: A Little Difficulty moving from lying on back to sitting on the side of the bed? : Unable Difficulty sitting down on and standing up from a chair with arms (e.g., wheelchair, bedside commode, etc,.)?: A Little Help needed moving to and from a bed to chair (including a wheelchair)?: A Little Help needed walking in hospital room?: A Little Help needed climbing 3-5 steps with a railing? : A Little 6 Click Score: 16    End of Session Equipment Utilized During Treatment: Gait belt Activity Tolerance: Patient tolerated treatment well Patient left: in chair;with call bell/phone within reach;with chair alarm set;with SCD's reapplied (B heels elevated via towel rolls; wound vac plugged in; ice pack in place) Nurse Communication: Mobility status;Precautions;Weight bearing status (Pt's pain status) PT Visit Diagnosis: Other abnormalities of gait and mobility (R26.89);Muscle weakness (generalized) (M62.81);Pain Pain - Right/Left: Left Pain - part of body: Hip     Time: 1425-1450 PT Time Calculation (min) (ACUTE ONLY): 25 min  Charges:  $Gait Training: 8-22 mins $Therapeutic Activity: 8-22 mins                    G CodesHendricks Limes, PT 05/04/17, 3:47 PM [phone]

## 2017-05-05 LAB — SURGICAL PATHOLOGY

## 2017-05-05 LAB — CBC
HEMATOCRIT: 28.9 % — AB (ref 35.0–47.0)
HEMOGLOBIN: 10.2 g/dL — AB (ref 12.0–16.0)
MCH: 31.4 pg (ref 26.0–34.0)
MCHC: 35.2 g/dL (ref 32.0–36.0)
MCV: 89 fL (ref 80.0–100.0)
Platelets: 152 10*3/uL (ref 150–440)
RBC: 3.24 MIL/uL — ABNORMAL LOW (ref 3.80–5.20)
RDW: 13 % (ref 11.5–14.5)
WBC: 12.1 10*3/uL — ABNORMAL HIGH (ref 3.6–11.0)

## 2017-05-05 LAB — BASIC METABOLIC PANEL
ANION GAP: 6 (ref 5–15)
BUN: 10 mg/dL (ref 6–20)
CALCIUM: 8.5 mg/dL — AB (ref 8.9–10.3)
CHLORIDE: 106 mmol/L (ref 101–111)
CO2: 29 mmol/L (ref 22–32)
CREATININE: 0.5 mg/dL (ref 0.44–1.00)
GFR calc Af Amer: >60 mL/min (ref >60)
GFR calc non Af Amer: >60 mL/min (ref >60)
GLUCOSE: 125 mg/dL — AB (ref 65–99)
Potassium: 3.5 mmol/L (ref 3.5–5.1)
Sodium: 141 mmol/L (ref 135–145)

## 2017-05-05 MED ORDER — ENOXAPARIN SODIUM 40 MG/0.4ML ~~LOC~~ SOLN
40.0000 mg | SUBCUTANEOUS | 0 refills | Status: DC
Start: 2017-05-06 — End: 2018-01-09

## 2017-05-05 MED ORDER — SCOPOLAMINE 1 MG/3DAYS TD PT72: 1.0000 | MEDICATED_PATCH | TRANSDERMAL | 0 refills | Status: DC

## 2017-05-05 MED ORDER — OXYCODONE HCL 5 MG PO TABS: 5.0000 mg | ORAL_TABLET | ORAL | 0 refills | Status: DC | PRN

## 2017-05-05 NOTE — Discharge Instructions (Signed)

## 2017-05-05 NOTE — Progress Notes (Signed)
Physical Therapy Treatment Patient Details Name: Briana Morris MRN: [id number] DOB: 1951/06/23 Today's Date: 05/05/2017    History of Present Illness Pt is a 66 y.o. female s/p L THA anterior approach 05/03/17.  PMH includes PONV, shoulder arthroscopy with RCR, and colon adenoma s/p resection.    PT Comments    Bed mobility with ease this session.  She was able to ambulate to/from PT gym and complete stair training without difficulty.  Pt with no questions or concerns.    Follow Up Recommendations  Home health PT     Equipment Recommendations  Rolling walker with 5" wheels    Recommendations for Other Services       Precautions / Restrictions Precautions Precautions: Anterior Hip;Fall Precaution Comments: Wound vac Restrictions Weight Bearing Restrictions: Yes LLE Weight Bearing: Weight bearing as tolerated    Mobility  Bed Mobility Overal bed mobility: Needs Assistance Bed Mobility: Supine to Sit;Sit to Supine     Supine to sit: Supervision Sit to supine: Supervision   General bed mobility comments: able to manage LE without assist today  Transfers Overall transfer level: Modified independent Equipment used: Rolling walker (2 wheeled) Transfers: Sit to/from Stand Sit to Stand: Supervision Stand pivot transfers: Min guard          Ambulation/Gait Ambulation/Gait assistance: Supervision;Min guard Ambulation Distance (Feet): 150 Feet Assistive device: Rolling walker (2 wheeled) Gait Pattern/deviations: Step-through pattern   Gait velocity interpretation: Below normal speed for age/gender     Stairs Stairs: Yes   Stair Management: One rail Left;Two rails Number of Stairs: 8 General stair comments: up/down steps with education for sequencing.  Pt did well with left rail only as she has at home.  Wheelchair Mobility    Modified Rankin (Stroke Patients Only)       Balance Overall balance assessment: Needs assistance Sitting-balance  support: No upper extremity supported Sitting balance-Leahy Scale: Good     Standing balance support: Bilateral upper extremity supported Standing balance-Leahy Scale: Good                              Cognition Arousal/Alertness: Awake/alert Behavior During Therapy: WFL for tasks assessed/performed Overall Cognitive Status: Within Functional Limits for tasks assessed                                        Exercises Total Joint Exercises Ankle Circles/Pumps: AROM;Strengthening;Both;10 reps;Supine Quad Sets: AROM;Strengthening;Both;10 reps;Supine Gluteal Sets: AROM;Strengthening;Both;10 reps;Supine Towel Squeeze: AROM;Strengthening;Both;10 reps;Supine Short Arc Quad: AAROM;Strengthening;Left;10 reps;Supine Heel Slides: AAROM;Strengthening;Left;10 reps;Supine Hip ABduction/ADduction: AAROM;Strengthening;Left;10 reps;Supine Long Arc Quad: AAROM;Seated;10 reps;Left Other Exercises Other Exercises: commode at bedside with min guard and ind self care    General Comments        Pertinent Vitals/Pain Pain Assessment: 0-10 Pain Score: 2  Pain Location: L hip Pain Descriptors / Indicators: Sore Pain Intervention(s): Limited activity within patient's tolerance;Ice applied;Monitored during session    Home Living                      Prior Function            PT Goals (current goals can now be found in the care plan section) Progress towards PT goals: Progressing toward goals    Frequency    BID      PT Plan Current plan remains appropriate  Co-evaluation              AM-PAC PT "6 Clicks" Daily Activity  Outcome Measure  Difficulty turning over in bed (including adjusting bedclothes, sheets and blankets)?: A Little Difficulty moving from lying on back to sitting on the side of the bed? : A Little Difficulty sitting down on and standing up from a chair with arms (e.g., wheelchair, bedside commode, etc,.)?: A Little Help  needed moving to and from a bed to chair (including a wheelchair)?: A Little Help needed walking in hospital room?: A Little Help needed climbing 3-5 steps with a railing? : A Little 6 Click Score: 18    End of Session Equipment Utilized During Treatment: Gait belt Activity Tolerance: Patient tolerated treatment well Patient left: in bed;with bed alarm set;with call bell/phone within reach;with SCD's reapplied;with family/visitor present   Pain - Right/Left: Left Pain - part of body: Hip     Time: 1130-1143 PT Time Calculation (min) (ACUTE ONLY): 13 min  Charges:  $Gait Training: 8-22 mins $Therapeutic Exercise: 8-22 mins                    G Codes:       Briana DessSarah Romey Morris, PTA 05/05/17, 11:45 AM

## 2017-05-05 NOTE — Care Management Important Message (Signed)
Important Message  Patient Details  Name: Briana Morris MRN: [id number] Date of Birth: 03-Mar-1951   Medicare Important Message Given:  Yes    Marily MemosLisa M Neesha Langton, RN 05/05/2017, 9:46 AM

## 2017-05-05 NOTE — Progress Notes (Signed)
Physical Therapy Treatment Patient Details Name: Briana Morris MRN: [id number] DOB: Nov 11, 1950 Today's Date: 05/05/2017    History of Present Illness Pt is a 66 y.o. female s/p L THA anterior approach 05/03/17.  PMH includes PONV, shoulder arthroscopy with RCR, and colon adenoma s/p resection.    PT Comments    Participated in exercises as described below.  Bed mobility with rail and HOB raised but no assist needed.  She was able to stand and ambulate around unit this am with overall good steady gait.  To commode upon return to room to void then remained up in recliner.  Overall tolerated therapy well this am with no nausea or dizziness.  Plan for stairs this pm.   Follow Up Recommendations  Home health PT     Equipment Recommendations  Rolling walker with 5" wheels    Recommendations for Other Services       Precautions / Restrictions Precautions Precautions: Anterior Hip;Fall Precaution Comments: Wound vac Restrictions Weight Bearing Restrictions: Yes LLE Weight Bearing: Weight bearing as tolerated    Mobility  Bed Mobility Overal bed mobility: Needs Assistance Bed Mobility: Supine to Sit     Supine to sit: Min guard;HOB elevated     General bed mobility comments: able to manage LE without assist today  Transfers Overall transfer level: Needs assistance Equipment used: Rolling walker (2 wheeled) Transfers: Sit to/from Stand Sit to Stand: Min guard Stand pivot transfers: Min guard          Ambulation/Gait Ambulation/Gait assistance: Min guard Ambulation Distance (Feet): 180 Feet Assistive device: Rolling walker (2 wheeled) Gait Pattern/deviations: Step-through pattern   Gait velocity interpretation: Below normal speed for age/gender     Stairs            Wheelchair Mobility    Modified Rankin (Stroke Patients Only)       Balance Overall balance assessment: Needs assistance Sitting-balance support: No upper extremity  supported Sitting balance-Leahy Scale: Good     Standing balance support: Bilateral upper extremity supported Standing balance-Leahy Scale: Fair                              Cognition Arousal/Alertness: Awake/alert Behavior During Therapy: WFL for tasks assessed/performed Overall Cognitive Status: Within Functional Limits for tasks assessed                                        Exercises Total Joint Exercises Ankle Circles/Pumps: AROM;Strengthening;Both;10 reps;Supine Quad Sets: AROM;Strengthening;Both;10 reps;Supine Gluteal Sets: AROM;Strengthening;Both;10 reps;Supine Towel Squeeze: AROM;Strengthening;Both;10 reps;Supine Short Arc Quad: AAROM;Strengthening;Left;10 reps;Supine Heel Slides: AAROM;Strengthening;Left;10 reps;Supine Hip ABduction/ADduction: AAROM;Strengthening;Left;10 reps;Supine Long Arc Quad: AAROM;Seated;10 reps;Left Other Exercises Other Exercises: commode at bedside with min guard and ind self care    General Comments        Pertinent Vitals/Pain Pain Assessment: 0-10 Pain Score: 2  Pain Location: L hip Pain Descriptors / Indicators: Sore Pain Intervention(s): Limited activity within patient's tolerance;Monitored during session    Home Living                      Prior Function            PT Goals (current goals can now be found in the care plan section) Progress towards PT goals: Progressing toward goals    Frequency    BID  PT Plan Current plan remains appropriate    Co-evaluation              AM-PAC PT "6 Clicks" Daily Activity  Outcome Measure  Difficulty turning over in bed (including adjusting bedclothes, sheets and blankets)?: A Little Difficulty moving from lying on back to sitting on the side of the bed? : A Little Difficulty sitting down on and standing up from a chair with arms (e.g., wheelchair, bedside commode, etc,.)?: A Little Help needed moving to and from a bed to chair  (including a wheelchair)?: A Little Help needed walking in hospital room?: A Little Help needed climbing 3-5 steps with a railing? : A Little 6 Click Score: 18    End of Session Equipment Utilized During Treatment: Gait belt Activity Tolerance: Patient tolerated treatment well Patient left: in chair;with call bell/phone within reach;with chair alarm set   Pain - Right/Left: Left Pain - part of body: Hip     Time: [id number] PT Time Calculation (min) (ACUTE ONLY): 23 min  Charges:  $Gait Training: 8-22 mins $Therapeutic Exercise: 8-22 mins                    G Codes:      Danielle DessSarah Charvis Lightner, PTA 05/05/17, 10:08 AM

## 2017-05-05 NOTE — Discharge Summary (Signed)
Physician Discharge Summary  Patient ID: Briana StallionJanice Daniels Morris MRN: [id number] DOB/AGE: Aug 21, 1951 66 y.o.  Admit date: 05/03/2017 Discharge date: 05/05/2017  Admission Diagnoses:  PRIMARY OSTEOARTHRITIS OF LEFT HIP   Discharge Diagnoses: Patient Active Problem List   Diagnosis Date Noted  . Primary osteoarthritis of left hip 05/03/2017    Past Medical History:  Diagnosis Date  . Arthritis   . GERD (gastroesophageal reflux disease)   . Hemorrhoids    a. Internal/External.  . Hypercholesteremia 2008  . PONV (postoperative nausea and vomiting)    nausea no vomiting  . Tubular adenoma of colon    a. 11/2014 s/p resection.     Transfusion: none   Consultants (if any):   Discharged Condition: Improved  Hospital Course: Briana StallionJanice Daniels Supak is an 66 y.o. female who was admitted 05/03/2017 with a diagnosis of hip osteoathritis and went to the operating room on 05/03/2017 and underwent the above named procedures.    Surgeries: Procedure(s): TOTAL HIP ARTHROPLASTY ANTERIOR APPROACH on 05/03/2017 Patient tolerated the surgery well. Taken to PACU where she was stabilized and then transferred to the orthopedic floor.  Started on Lovenox 40 q 24 hrs. Foot pumps applied bilaterally at 80 mm. Heels elevated on bed with rolled towels. No evidence of DVT. Negative Homan. Physical therapy started on day #1 for gait training and transfer. OT started day #1 for ADL and assisted devices.  Patient's foley was d/c on day #1. Patient's IV and was d/c on day #2.  On post op day #2 patient was stable and ready for discharge to home with home health PT.  Implants:  Medacta Mpact cup DM with liner and De[place]w Actis 5 stem, 28 mm +1.5 Biolox ceramic head  She was given perioperative antibiotics:  Anti-infectives    Start     Dose/Rate Route Frequency Ordered Stop   05/03/17 1900  ceFAZolin (ANCEF) IVPB 2g/100 mL premix     2 g 200 mL/hr over 30 Minutes Intravenous Every 6 hours 05/03/17 1551  05/04/17 0929   05/03/17 1151  ceFAZolin (ANCEF) 2-4 GM/100ML-% IVPB    Comments:  Letta PateKinney, Nicole   : cabinet override      05/03/17 1151 05/03/17 1258   05/03/17 0900  ceFAZolin (ANCEF) IVPB 2g/100 mL premix     2 g 200 mL/hr over 30 Minutes Intravenous  Once 05/03/17 0858 05/03/17 1313    .  She was given sequential compression devices, early ambulation, and lovenox for DVT prophylaxis.  She benefited maximally from the hospital stay and there were no complications.    Recent vital signs:  Vitals:   05/05/17 1000 05/05/17 1020  BP:    Pulse:    Resp:    Temp: 99.2 F (37.3 C) 99.2 F (37.3 C)  SpO2:      Recent laboratory studies:  Lab Results  Component Value Date   HGB 10.2 (L) 05/05/2017   HGB 11.6 (L) 05/03/2017   HGB 13.6 04/20/2017   Lab Results  Component Value Date   WBC 12.1 (H) 05/05/2017   PLT 152 05/05/2017   Lab Results  Component Value Date   INR 0.92 04/20/2017   Lab Results  Component Value Date   NA 141 05/05/2017   K 3.5 05/05/2017   CL 106 05/05/2017   CO2 29 05/05/2017   BUN 10 05/05/2017   CREATININE 0.50 05/05/2017   GLUCOSE 125 (H) 05/05/2017    Discharge Medications:   Allergies as of 05/05/2017   No Known Allergies  Medication List    STOP taking these medications   meloxicam 7.5 MG tablet Commonly known as:  MOBIC     TAKE these medications   ADVIL PM 200-38 MG Tabs Generic drug:  Ibuprofen-Diphenhydramine Cit Take 1 tablet by mouth at bedtime.   AMABELZ 1-0.5 MG tablet Generic drug:  estradiol-norethindrone Take 1 tablet by mouth daily.   aspirin EC 81 MG tablet Take 81 mg by mouth daily.   enoxaparin 40 MG/0.4ML injection Commonly known as:  LOVENOX Inject 0.4 mLs (40 mg total) into the skin daily.   multivitamin with minerals Tabs tablet Take 1 tablet by mouth daily.   omeprazole 40 MG capsule Commonly known as:  PRILOSEC Take 40 mg by mouth at bedtime.   oxyCODONE 5 MG immediate release  tablet Commonly known as:  Oxy IR/ROXICODONE Take 1-2 tablets (5-10 mg total) by mouth every 4 (four) hours as needed for breakthrough pain.   pravastatin 40 MG tablet Commonly known as:  PRAVACHOL Take 40 mg by mouth at bedtime.   scopolamine 1 MG/3DAYS Commonly known as:  TRANSDERM-SCOP Place 1 patch (1.5 mg total) onto the skin every 3 (three) days.   tiZANidine 4 MG tablet Commonly known as:  ZANAFLEX Take 4 mg by mouth at bedtime as needed for muscle spasms.   venlafaxine XR 37.5 MG 24 hr capsule Commonly known as:  EFFEXOR-XR Take 37.5 mg by mouth daily.   VISINE MAXIMUM REDNESS RELIEF OP Place 1 drop into both eyes daily as needed (red eyes).            Durable Medical Equipment        Start     Ordered   05/03/17 1552  DME Walker rolling  Once    Question:  Patient needs a walker to treat with the following condition  Answer:  Status post total hip replacement, left   05/03/17 1551   05/03/17 1552  DME 3 n 1  Once     05/03/17 1551   05/03/17 1552  DME Bedside commode  Once    Question:  Patient needs a bedside commode to treat with the following condition  Answer:  Status post total hip replacement, left   05/03/17 1551       Discharge Care Instructions        Start     Ordered   05/07/17 0000  scopolamine (TRANSDERM-SCOP) 1 MG/3DAYS  every 72 hours    Question:  Supervising Provider  Answer:  Kennedy Bucker   05/05/17 1730   05/06/17 0000  enoxaparin (LOVENOX) 40 MG/0.4ML injection  Every 24 hours    Question:  Supervising Provider  Answer:  Kennedy Bucker   05/05/17 1730   05/05/17 0000  oxyCODONE (OXY IR/ROXICODONE) 5 MG immediate release tablet  Every 4 hours PRN    Question:  Supervising Provider  AnswerKennedy Bucker   05/05/17 1730      Diagnostic Studies: Dg Hip Operative Unilat W Or W/o Pelvis Left  Result Date: 05/03/2017 CLINICAL DATA:  Surgery, elective.  Left hip replacement. EXAM: OPERATIVE left HIP (WITH PELVIS IF PERFORMED) 3  VIEWS TECHNIQUE: Fluoroscopic spot image(s) were submitted for interpretation post-operatively. COMPARISON:  None. FINDINGS: Left hip hemiarthroplasty is noted. Initial image demonstrates significant loss of joint space with advanced osteoarthritis. Subsequent images demonstrate a left hemi prosthesis in place. The distal end of the prosthesis is not imaged. IMPRESSION: 1. Left hip hemiprosthesis without radiographic evidence for complication. Electronically Signed   By: Cristal Deer  Mattern M.D.   On: 05/03/2017 14:21   Dg Hip Unilat W Or W/o Pelvis 2-3 Views Left  Result Date: 05/03/2017 CLINICAL DATA:  Left total hip arthroplasty. EXAM: DG HIP (WITH OR WITHOUT PELVIS) 2-3V LEFT COMPARISON:  Same day x-rays. FINDINGS: Postsurgical changes related to left total hip arthroplasty. Components appear well aligned. No evidence of hardware failure. Skin staples and wound VAC in place. IMPRESSION: Interval left total hip arthroplasty without evidence of acute postoperative complication. Electronically Signed   By: Obie Dredge M.D.   On: 05/03/2017 15:04    Disposition: Final discharge disposition not confirmed    Follow-up Information    Kennedy Bucker, MD Follow up in 2 week(s).   Specialty:  Orthopedic Surgery Contact information: [address] [place] [address] [phone]            Signed: Patience Musca 05/05/2017, 5:31 PM

## 2017-05-05 NOTE — Progress Notes (Signed)
Pt discharged to home via wheelchair without incident per MD order accompanied by her spouse.Prior to discharge, all discharge teachings done both written and verbal. All questions answered. Pt verbalizes understanding and agrees to comply. Spouse was able to verbalize proper technique for Lovenox administration.  Pt has follow up appointments scheduled with Dr. Rosita KeaMenz. Prior to d/c wound vac changed from bed vac to portable prevena vac. Pt was given new dressing to place on incision when wound vac's battery is dead in approx 7 days. Pt instructed to remove wound vac from incision and replace the honeycomb dressing. Pt discharged with oxycodone prescription and pt aware that she needs to pickup Lovenox and scopalamine patches from pharmacy. Pt pain controlled on discharge. No change in patient from AM assessment except pt had 2 BM's.

## 2017-05-05 NOTE — Progress Notes (Signed)
Pt instructed verbally proper Lovenox administration. Pt states that her husband will be giving her her injections and he recently had orthopedic surgery. Pt able to verbally state proper Lovenox administration. All questions answered and pt verbalizes understanding.

## 2017-05-05 NOTE — Progress Notes (Signed)
   Subjective: 2 Days Post-Op Procedure(s) (LRB): TOTAL HIP ARTHROPLASTY ANTERIOR APPROACH (Left) Patient reports pain as mild.   Patient is well, and has had no acute complaints or problems Denies any CP, SOB, ABD pain. We will continue therapy today.  Plan is to go Home after hospital stay.  Objective: Vital signs in last 24 hours: Temp:  [98.5 F (36.9 C)-99.5 F (37.5 C)] 99.5 F (37.5 C) (08/29 1947) Pulse Rate:  [86-98] 98 (08/29 1947) Resp:  [17-19] 19 (08/29 1947) BP: (120-134)/(64-73) 134/73 (08/29 1947) SpO2:  [96 %-100 %] 100 % (08/29 1947)  Intake/Output from previous day: 08/29 0701 - 08/30 0700 In: 480 [P.O.:480] Out: 2100 [Urine:2100] Intake/Output this shift: No intake/output data recorded.   Recent Labs  05/03/17 2145 05/05/17 0359  HGB 11.6* 10.2*    Recent Labs  05/03/17 2145 05/05/17 0359  WBC 17.6* 12.1*  RBC 3.78* 3.24*  HCT 33.5* 28.9*  PLT 169 152    Recent Labs  05/03/17 2145 05/05/17 0359  NA  --  141  K  --  3.5  CL  --  106  CO2  --  29  BUN  --  10  CREATININE 0.60 0.50  GLUCOSE  --  125*  CALCIUM  --  8.5*   No results for input(s): LABPT, INR in the last 72 hours.  EXAM General - Patient is Alert, Appropriate and Oriented Extremity - Neurovascular intact Sensation intact distally Intact pulses distally Dorsiflexion/Plantar flexion intact No cellulitis present Compartment soft Dressing - dressing C/D/I, no drainage and wound vac intact Motor Function - intact, moving foot and toes well on exam.   Past Medical History:  Diagnosis Date  . Arthritis   . GERD (gastroesophageal reflux disease)   . Hemorrhoids    a. Internal/External.  . Hypercholesteremia 2008  . PONV (postoperative nausea and vomiting)    nausea no vomiting  . Tubular adenoma of colon    a. 11/2014 s/p resection.    Assessment/Plan:   2 Days Post-Op Procedure(s) (LRB): TOTAL HIP ARTHROPLASTY ANTERIOR APPROACH (Left) Active Problems:  Primary osteoarthritis of left hip  Estimated body mass index is 27.62 kg/m as calculated from the following:   Height as of this encounter: 5\' 2"  (1.575 m).   Weight as of this encounter: 68.5 kg (151 lb). Advance diet Up with therapy  Needs BM Pain well controlled VSS CM to assist with discharge home with HHPT   DVT Prophylaxis - Lovenox, Foot Pumps and TED hose Weight-Bearing as tolerated to left leg   T. Cranston Neighborhris Gaines, PA-C [facility] 05/05/2017, 7:04 AM

## 2017-05-07 DIAGNOSIS — Z96642 Presence of left artificial hip joint: Secondary | ICD-10-CM | POA: Diagnosis not present

## 2017-05-07 DIAGNOSIS — Z471 Aftercare following joint replacement surgery: Secondary | ICD-10-CM | POA: Diagnosis not present

## 2017-05-10 DIAGNOSIS — Z96642 Presence of left artificial hip joint: Secondary | ICD-10-CM | POA: Diagnosis not present

## 2017-05-10 DIAGNOSIS — Z471 Aftercare following joint replacement surgery: Secondary | ICD-10-CM | POA: Diagnosis not present

## 2017-05-12 DIAGNOSIS — Z96642 Presence of left artificial hip joint: Secondary | ICD-10-CM | POA: Diagnosis not present

## 2017-05-12 DIAGNOSIS — Z471 Aftercare following joint replacement surgery: Secondary | ICD-10-CM | POA: Diagnosis not present

## 2017-05-25 DIAGNOSIS — Z471 Aftercare following joint replacement surgery: Secondary | ICD-10-CM | POA: Diagnosis not present

## 2017-05-25 DIAGNOSIS — Z96642 Presence of left artificial hip joint: Secondary | ICD-10-CM | POA: Diagnosis not present

## 2017-06-15 DIAGNOSIS — Z96642 Presence of left artificial hip joint: Secondary | ICD-10-CM | POA: Diagnosis not present

## 2017-07-19 DIAGNOSIS — R69 Illness, unspecified: Secondary | ICD-10-CM | POA: Diagnosis not present

## 2017-10-05 DIAGNOSIS — H16143 Punctate keratitis, bilateral: Secondary | ICD-10-CM | POA: Diagnosis not present

## 2017-10-05 DIAGNOSIS — H04123 Dry eye syndrome of bilateral lacrimal glands: Secondary | ICD-10-CM | POA: Diagnosis not present

## 2017-10-05 DIAGNOSIS — H11042 Peripheral pterygium, stationary, left eye: Secondary | ICD-10-CM | POA: Diagnosis not present

## 2017-12-26 ENCOUNTER — Other Ambulatory Visit: Payer: Self-pay | Admitting: Family Medicine

## 2018-01-04 DIAGNOSIS — M1611 Unilateral primary osteoarthritis, right hip: Secondary | ICD-10-CM | POA: Diagnosis not present

## 2018-01-04 DIAGNOSIS — Z0000 Encounter for general adult medical examination without abnormal findings: Secondary | ICD-10-CM | POA: Diagnosis not present

## 2018-01-04 DIAGNOSIS — M25551 Pain in right hip: Secondary | ICD-10-CM | POA: Diagnosis not present

## 2018-01-04 DIAGNOSIS — E785 Hyperlipidemia, unspecified: Secondary | ICD-10-CM | POA: Diagnosis not present

## 2018-01-09 DIAGNOSIS — Z0000 Encounter for general adult medical examination without abnormal findings: Secondary | ICD-10-CM | POA: Diagnosis not present

## 2018-01-09 DIAGNOSIS — N951 Menopausal and female climacteric states: Secondary | ICD-10-CM | POA: Diagnosis not present

## 2018-01-09 DIAGNOSIS — Z23 Encounter for immunization: Secondary | ICD-10-CM | POA: Diagnosis not present

## 2018-01-09 DIAGNOSIS — E785 Hyperlipidemia, unspecified: Secondary | ICD-10-CM | POA: Diagnosis not present

## 2018-01-10 ENCOUNTER — Ambulatory Visit
Admission: RE | Admit: 2018-01-10 | Discharge: 2018-01-10 | Disposition: A | Discharge status: Home / Self Care | Payer: Medicare HMO | Source: Ambulatory Visit | Attending: Family Medicine | Admitting: Family Medicine

## 2018-01-10 ENCOUNTER — Other Ambulatory Visit: Payer: Self-pay | Admitting: Family Medicine

## 2018-01-10 DIAGNOSIS — Z1231 Encounter for screening mammogram for malignant neoplasm of breast: Secondary | ICD-10-CM | POA: Diagnosis not present

## 2018-01-13 ENCOUNTER — Encounter
Admission: RE | Admit: 2018-01-13 | Discharge: 2018-01-13 | Disposition: A | Discharge status: Home / Self Care | Payer: Medicare HMO | Source: Ambulatory Visit | Attending: Orthopedic Surgery | Admitting: Orthopedic Surgery

## 2018-01-13 ENCOUNTER — Other Ambulatory Visit: Payer: Self-pay

## 2018-01-13 DIAGNOSIS — M1611 Unilateral primary osteoarthritis, right hip: Secondary | ICD-10-CM | POA: Insufficient documentation

## 2018-01-13 DIAGNOSIS — Z791 Long term (current) use of non-steroidal anti-inflammatories (NSAID): Secondary | ICD-10-CM | POA: Insufficient documentation

## 2018-01-13 DIAGNOSIS — Z01812 Encounter for preprocedural laboratory examination: Secondary | ICD-10-CM | POA: Insufficient documentation

## 2018-01-13 DIAGNOSIS — Z79899 Other long term (current) drug therapy: Secondary | ICD-10-CM | POA: Diagnosis not present

## 2018-01-13 DIAGNOSIS — Z01818 Encounter for other preprocedural examination: Secondary | ICD-10-CM | POA: Insufficient documentation

## 2018-01-13 DIAGNOSIS — Z87891 Personal history of nicotine dependence: Secondary | ICD-10-CM | POA: Diagnosis not present

## 2018-01-13 DIAGNOSIS — K219 Gastro-esophageal reflux disease without esophagitis: Secondary | ICD-10-CM | POA: Diagnosis not present

## 2018-01-13 DIAGNOSIS — E7800 Pure hypercholesterolemia, unspecified: Secondary | ICD-10-CM | POA: Insufficient documentation

## 2018-01-13 DIAGNOSIS — Z7982 Long term (current) use of aspirin: Secondary | ICD-10-CM | POA: Diagnosis not present

## 2018-01-13 LAB — BASIC METABOLIC PANEL
Anion gap: 7 (ref 5–15)
BUN: 21 mg/dL — AB (ref 6–20)
CHLORIDE: 101 mmol/L (ref 101–111)
CO2: 29 mmol/L (ref 22–32)
Calcium: 9.5 mg/dL (ref 8.9–10.3)
Creatinine, Ser: 0.65 mg/dL (ref 0.44–1.00)
GFR calc non Af Amer: >60 mL/min (ref >60)
Glucose, Bld: 94 mg/dL (ref 65–99)
Potassium: 3.8 mmol/L (ref 3.5–5.1)
SODIUM: 137 mmol/L (ref 135–145)

## 2018-01-13 LAB — TYPE AND SCREEN
ABO/RH(D): O POS
ANTIBODY SCREEN: NEGATIVE

## 2018-01-13 LAB — URINALYSIS, ROUTINE W REFLEX MICROSCOPIC
BILIRUBIN URINE: NEGATIVE
GLUCOSE, UA: NEGATIVE mg/dL
HGB URINE DIPSTICK: NEGATIVE
Ketones, ur: NEGATIVE mg/dL
Leukocytes, UA: NEGATIVE
Nitrite: NEGATIVE
PROTEIN: NEGATIVE mg/dL
Specific Gravity, Urine: 1.023 (ref 1.005–1.030)
pH: 6 (ref 5.0–8.0)

## 2018-01-13 LAB — APTT: APTT: 27 s (ref 24–36)

## 2018-01-13 LAB — CBC
HCT: 38.5 % (ref 35.0–47.0)
Hemoglobin: 13.3 g/dL (ref 12.0–16.0)
MCH: 31.6 pg (ref 26.0–34.0)
MCHC: 34.7 g/dL (ref 32.0–36.0)
MCV: 91.1 fL (ref 80.0–100.0)
PLATELETS: 199 10*3/uL (ref 150–440)
RBC: 4.22 MIL/uL (ref 3.80–5.20)
RDW: 13.3 % (ref 11.5–14.5)
WBC: 8.6 10*3/uL (ref 3.6–11.0)

## 2018-01-13 LAB — SEDIMENTATION RATE: SED RATE: 11 mm/h (ref 0–30)

## 2018-01-13 LAB — PROTIME-INR
INR: 0.91
Prothrombin Time: 12.2 seconds (ref 11.4–15.2)

## 2018-01-13 LAB — SURGICAL PCR SCREEN
MRSA, PCR: NEGATIVE
Staphylococcus aureus: NEGATIVE

## 2018-01-13 NOTE — Patient Instructions (Signed)
Your procedure is scheduled on: Tues. 5/21 Report to Day Surgery. To find out your arrival time please call [phone] between 1PM - 3PM on Mon. 5/20.  Remember: Instructions that are not followed completely may result in serious medical risk, up to and including death, or upon the discretion of your surgeon and anesthesiologist your surgery may need to be rescheduled.     _X__ 1. Do not eat food after midnight the night before your procedure.                 No gum chewing or hard candies. You may drink clear liquids up to 2 hours                 before you are scheduled to arrive for your surgery- DO not drink clear                 liquids within 2 hours of the start of your surgery.                 Clear Liquids include:  water, apple juice without pulp, clear carbohydrate                 drink such as Clearfast of Gartorade, Black Coffee or Tea (Do not add                 anything to coffee or tea).  __X__2.  On the morning of surgery brush your teeth with toothpaste and water, you  may rinse your mouth with mouthwash if you wish.  Do not swallow any              toothpaste of mouthwash.     _X__ 3.  No Alcohol for 24 hours before or after surgery.   ___ 4.  Do Not Smoke or use e-cigarettes For 24 Hours Prior to Your Surgery.                 Do not use any chewable tobacco products for at least 6 hours prior to                 surgery.  ____  5.  Bring all medications with you on the day of surgery if instructed.   __x__  6.  Notify your doctor if there is any change in your medical condition      (cold, fever, infections).     Do not wear jewelry, make-up, hairpins, clips or nail polish. Do not wear lotions, powders, or perfumes. You may wear deodorant. Do not shave 48 hours prior to surgery. Men may shave face and neck. Do not bring valuables to the hospital.    [facility] is not responsible for any belongings or valuables.  Contacts, dentures or  bridgework may not be worn into surgery. Leave your suitcase in the car. After surgery it may be brought to your room. For patients admitted to the hospital, discharge time is determined by your treatment team.   Patients discharged the day of surgery will not be allowed to drive home.   Please read over the following fact sheets that you were given:   __x__ Take these medicines the morning of surgery with A SIP OF WATER:    1. omeprazole (PRILOSEC) 40 MG capsule  2.venlafaxine XR (EFFEXOR-XR) 75 MG 24 hr capsule   3.   4.  5.  6.  ____ Fleet Enema (as directed)   __x__ Use CHG Soap as directed  ____ Use inhalers on the day of surgery  ____ Stop metformin 2 days prior to surgery    ____ Take 1/2 of usual insulin dose the night before surgery. No insulin the morning          of surgery.   __x__ Stop aspirin on 5/14  __x__ Stop Anti-inflammatories on 5/14 meloxicam (MOBIC) 15 MG tablet Ibuprofen or Aleve    May tylenol up to  in 24 hours   ____ Stop supplements until after surgery.    ____ Bring C-Pap to the hospital.

## 2018-01-14 LAB — URINE CULTURE: Culture: NO GROWTH

## 2018-01-23 MED ORDER — CEFAZOLIN SODIUM-DEXTROSE 2-4 GM/100ML-% IV SOLN
2.0000 g | Freq: Once | INTRAVENOUS | Status: AC
  Administered 2018-01-24: 2 g via INTRAVENOUS

## 2018-01-23 MED ORDER — TRANEXAMIC ACID 1000 MG/10ML IV SOLN
1000.0000 mg | INTRAVENOUS | Status: AC
  Administered 2018-01-24: 1000 mg via INTRAVENOUS
  Filled 2018-01-23: qty 1100

## 2018-01-24 ENCOUNTER — Other Ambulatory Visit: Payer: Self-pay

## 2018-01-24 ENCOUNTER — Inpatient Hospital Stay: Payer: Medicare HMO | Admitting: Certified Registered Nurse Anesthetist

## 2018-01-24 ENCOUNTER — Inpatient Hospital Stay: Payer: Medicare HMO

## 2018-01-24 ENCOUNTER — Encounter
Admission: RE | Disposition: A | Discharge status: Home Health | Payer: Self-pay | Source: Ambulatory Visit | Attending: Orthopedic Surgery

## 2018-01-24 ENCOUNTER — Inpatient Hospital Stay
Admission: RE | Admit: 2018-01-24 | Discharge: 2018-01-26 | DRG: 470 | Disposition: A | Discharge status: Home Health | Payer: Medicare HMO | Source: Ambulatory Visit | Attending: Orthopedic Surgery | Admitting: Orthopedic Surgery

## 2018-01-24 DIAGNOSIS — E7800 Pure hypercholesterolemia, unspecified: Secondary | ICD-10-CM | POA: Diagnosis present

## 2018-01-24 DIAGNOSIS — Z471 Aftercare following joint replacement surgery: Secondary | ICD-10-CM | POA: Diagnosis not present

## 2018-01-24 DIAGNOSIS — Z23 Encounter for immunization: Secondary | ICD-10-CM | POA: Diagnosis not present

## 2018-01-24 DIAGNOSIS — Z419 Encounter for procedure for purposes other than remedying health state, unspecified: Secondary | ICD-10-CM

## 2018-01-24 DIAGNOSIS — Z87891 Personal history of nicotine dependence: Secondary | ICD-10-CM | POA: Diagnosis not present

## 2018-01-24 DIAGNOSIS — G8918 Other acute postprocedural pain: Secondary | ICD-10-CM

## 2018-01-24 DIAGNOSIS — K219 Gastro-esophageal reflux disease without esophagitis: Secondary | ICD-10-CM | POA: Diagnosis present

## 2018-01-24 DIAGNOSIS — M1611 Unilateral primary osteoarthritis, right hip: Secondary | ICD-10-CM | POA: Diagnosis not present

## 2018-01-24 DIAGNOSIS — Z96641 Presence of right artificial hip joint: Secondary | ICD-10-CM | POA: Diagnosis not present

## 2018-01-24 HISTORY — PX: TOTAL HIP ARTHROPLASTY: SHX124

## 2018-01-24 LAB — CBC
HCT: 35.9 % (ref 35.0–47.0)
Hemoglobin: 12.2 g/dL (ref 12.0–16.0)
MCH: 30.9 pg (ref 26.0–34.0)
MCHC: 33.9 g/dL (ref 32.0–36.0)
MCV: 90.9 fL (ref 80.0–100.0)
PLATELETS: 168 10*3/uL (ref 150–440)
RBC: 3.95 MIL/uL (ref 3.80–5.20)
RDW: 12.9 % (ref 11.5–14.5)
WBC: 12.1 10*3/uL — ABNORMAL HIGH (ref 3.6–11.0)

## 2018-01-24 LAB — CREATININE, SERUM
CREATININE: 0.78 mg/dL (ref 0.44–1.00)
GFR calc Af Amer: >60 mL/min (ref >60)

## 2018-01-24 SURGERY — ARTHROPLASTY, HIP, TOTAL, ANTERIOR APPROACH
Anesthesia: General | Site: Hip | Laterality: Right | Wound class: Clean

## 2018-01-24 MED ORDER — LACTATED RINGERS IV SOLN
INTRAVENOUS | Status: DC
  Administered 2018-01-24: 14:00:00 via INTRAVENOUS

## 2018-01-24 MED ORDER — PROPOFOL 10 MG/ML IV BOLUS
INTRAVENOUS | Status: DC | PRN
  Administered 2018-01-24: 20 mg via INTRAVENOUS

## 2018-01-24 MED ORDER — TRAMADOL HCL 50 MG PO TABS
50.0000 mg | ORAL_TABLET | Freq: Four times a day (QID) | ORAL | Status: DC
  Administered 2018-01-25 – 2018-01-26 (×4): 50 mg via ORAL
  Filled 2018-01-24 (×6): qty 1

## 2018-01-24 MED ORDER — VENLAFAXINE HCL ER 75 MG PO CP24
75.0000 mg | ORAL_CAPSULE | Freq: Every day | ORAL | Status: DC
  Administered 2018-01-25 – 2018-01-26 (×2): 75 mg via ORAL
  Filled 2018-01-24 (×2): qty 1

## 2018-01-24 MED ORDER — PROPOFOL 500 MG/50ML IV EMUL
INTRAVENOUS | Status: DC | PRN
  Administered 2018-01-24: 50 ug/kg/min via INTRAVENOUS

## 2018-01-24 MED ORDER — LORATADINE 10 MG PO TABS
10.0000 mg | ORAL_TABLET | Freq: Every day | ORAL | Status: DC
  Administered 2018-01-25 – 2018-01-26 (×2): 10 mg via ORAL
  Filled 2018-01-24 (×2): qty 1

## 2018-01-24 MED ORDER — ACETAMINOPHEN 325 MG PO TABS
325.0000 mg | ORAL_TABLET | Freq: Four times a day (QID) | ORAL | Status: DC | PRN
  Administered 2018-01-25 – 2018-01-26 (×2): 650 mg via ORAL
  Filled 2018-01-24 (×2): qty 2

## 2018-01-24 MED ORDER — MAGNESIUM CITRATE PO SOLN
1.0000 | Freq: Once | ORAL | Status: DC | PRN
  Filled 2018-01-24: qty 296

## 2018-01-24 MED ORDER — GLYCOPYRROLATE 0.2 MG/ML IJ SOLN
INTRAMUSCULAR | Status: AC
  Filled 2018-01-24: qty 1

## 2018-01-24 MED ORDER — FENTANYL CITRATE (PF) 100 MCG/2ML IJ SOLN: 25.0000 ug | INTRAMUSCULAR | Status: DC | PRN

## 2018-01-24 MED ORDER — ONDANSETRON HCL 4 MG/2ML IJ SOLN: 4.0000 mg | Freq: Once | INTRAMUSCULAR | Status: DC | PRN

## 2018-01-24 MED ORDER — PNEUMOCOCCAL VAC POLYVALENT 25 MCG/0.5ML IJ INJ
0.5000 mL | INJECTION | INTRAMUSCULAR | Status: AC
  Administered 2018-01-25: 0.5 mL via INTRAMUSCULAR
  Filled 2018-01-24: qty 0.5

## 2018-01-24 MED ORDER — BUPIVACAINE HCL (PF) 0.5 % IJ SOLN
INTRAMUSCULAR | Status: DC | PRN
  Administered 2018-01-24: 3 mL via INTRATHECAL

## 2018-01-24 MED ORDER — METHOCARBAMOL 1000 MG/10ML IJ SOLN
500.0000 mg | Freq: Four times a day (QID) | INTRAVENOUS | Status: DC | PRN
  Filled 2018-01-24: qty 5

## 2018-01-24 MED ORDER — PHENOL 1.4 % MT LIQD
1.0000 | OROMUCOSAL | Status: DC | PRN
  Filled 2018-01-24: qty 177

## 2018-01-24 MED ORDER — ONDANSETRON HCL 4 MG PO TABS: 4.0000 mg | ORAL_TABLET | Freq: Four times a day (QID) | ORAL | Status: DC | PRN

## 2018-01-24 MED ORDER — ONDANSETRON HCL 4 MG/2ML IJ SOLN
INTRAMUSCULAR | Status: AC
  Filled 2018-01-24: qty 2

## 2018-01-24 MED ORDER — CEFAZOLIN SODIUM-DEXTROSE 2-4 GM/100ML-% IV SOLN
INTRAVENOUS | Status: AC
  Filled 2018-01-24: qty 100

## 2018-01-24 MED ORDER — EPHEDRINE SULFATE 50 MG/ML IJ SOLN
INTRAMUSCULAR | Status: DC | PRN
  Administered 2018-01-24: 10 mg via INTRAVENOUS
  Administered 2018-01-24: 5 mg via INTRAVENOUS
  Administered 2018-01-24: 10 mg via INTRAVENOUS

## 2018-01-24 MED ORDER — SIMVASTATIN 20 MG PO TABS
40.0000 mg | ORAL_TABLET | Freq: Every day | ORAL | Status: DC
  Administered 2018-01-25 – 2018-01-26 (×2): 40 mg via ORAL
  Filled 2018-01-24 (×2): qty 2

## 2018-01-24 MED ORDER — ONDANSETRON HCL 4 MG/2ML IJ SOLN: 4.0000 mg | Freq: Four times a day (QID) | INTRAMUSCULAR | Status: DC | PRN

## 2018-01-24 MED ORDER — METHOCARBAMOL 500 MG PO TABS
500.0000 mg | ORAL_TABLET | Freq: Four times a day (QID) | ORAL | Status: DC | PRN
  Administered 2018-01-24 – 2018-01-26 (×3): 500 mg via ORAL
  Filled 2018-01-24 (×3): qty 1

## 2018-01-24 MED ORDER — DOCUSATE SODIUM 100 MG PO CAPS
100.0000 mg | ORAL_CAPSULE | Freq: Two times a day (BID) | ORAL | Status: DC
  Administered 2018-01-24 – 2018-01-26 (×4): 100 mg via ORAL
  Filled 2018-01-24 (×4): qty 1

## 2018-01-24 MED ORDER — METOCLOPRAMIDE HCL 10 MG PO TABS: 5.0000 mg | ORAL_TABLET | Freq: Three times a day (TID) | ORAL | Status: DC | PRN

## 2018-01-24 MED ORDER — ASPIRIN EC 81 MG PO TBEC
81.0000 mg | DELAYED_RELEASE_TABLET | Freq: Every day | ORAL | Status: DC
  Administered 2018-01-25: 81 mg via ORAL
  Filled 2018-01-24 (×2): qty 1

## 2018-01-24 MED ORDER — SCOPOLAMINE 1 MG/3DAYS TD PT72
1.0000 | MEDICATED_PATCH | Freq: Once | TRANSDERMAL | Status: DC
  Administered 2018-01-24: 1.5 mg via TRANSDERMAL

## 2018-01-24 MED ORDER — DIPHENHYDRAMINE HCL 12.5 MG/5ML PO ELIX: 12.5000 mg | ORAL_SOLUTION | ORAL | Status: DC | PRN

## 2018-01-24 MED ORDER — GABAPENTIN 300 MG PO CAPS
300.0000 mg | ORAL_CAPSULE | Freq: Three times a day (TID) | ORAL | Status: DC
  Administered 2018-01-24 – 2018-01-26 (×5): 300 mg via ORAL
  Filled 2018-01-24 (×5): qty 1

## 2018-01-24 MED ORDER — BUPIVACAINE-EPINEPHRINE (PF) 0.25% -1:200000 IJ SOLN
INTRAMUSCULAR | Status: AC
  Filled 2018-01-24: qty 30

## 2018-01-24 MED ORDER — BUPIVACAINE HCL (PF) 0.5 % IJ SOLN
INTRAMUSCULAR | Status: AC
  Filled 2018-01-24: qty 10

## 2018-01-24 MED ORDER — HYDROCODONE-ACETAMINOPHEN 7.5-325 MG PO TABS: 1.0000 | ORAL_TABLET | ORAL | Status: DC | PRN

## 2018-01-24 MED ORDER — SCOPOLAMINE 1 MG/3DAYS TD PT72
MEDICATED_PATCH | TRANSDERMAL | Status: AC
  Administered 2018-01-24: 1.5 mg via TRANSDERMAL
  Filled 2018-01-24: qty 1

## 2018-01-24 MED ORDER — MIDAZOLAM HCL 2 MG/2ML IJ SOLN
INTRAMUSCULAR | Status: AC
  Filled 2018-01-24: qty 2

## 2018-01-24 MED ORDER — MENTHOL 3 MG MT LOZG
1.0000 | LOZENGE | OROMUCOSAL | Status: DC | PRN
  Filled 2018-01-24: qty 9

## 2018-01-24 MED ORDER — PHENYLEPHRINE HCL 10 MG/ML IJ SOLN
INTRAMUSCULAR | Status: DC | PRN
  Administered 2018-01-24 (×3): 100 ug via INTRAVENOUS

## 2018-01-24 MED ORDER — DIPHENHYDRAMINE HCL 25 MG PO CAPS
25.0000 mg | ORAL_CAPSULE | Freq: Every day | ORAL | Status: DC
  Administered 2018-01-24 – 2018-01-25 (×2): 25 mg via ORAL
  Filled 2018-01-24 (×2): qty 1

## 2018-01-24 MED ORDER — ADULT MULTIVITAMIN W/MINERALS CH
1.0000 | ORAL_TABLET | Freq: Every day | ORAL | Status: DC
  Administered 2018-01-25 – 2018-01-26 (×2): 1 via ORAL
  Filled 2018-01-24 (×2): qty 1

## 2018-01-24 MED ORDER — ZOLPIDEM TARTRATE 5 MG PO TABS: 5.0000 mg | ORAL_TABLET | Freq: Every evening | ORAL | Status: DC | PRN

## 2018-01-24 MED ORDER — FAMOTIDINE 20 MG PO TABS: 20.0000 mg | ORAL_TABLET | Freq: Once | ORAL | Status: DC

## 2018-01-24 MED ORDER — CEFAZOLIN SODIUM-DEXTROSE 2-4 GM/100ML-% IV SOLN
2.0000 g | Freq: Four times a day (QID) | INTRAVENOUS | Status: AC
  Administered 2018-01-24 – 2018-01-25 (×3): 2 g via INTRAVENOUS
  Filled 2018-01-24 (×3): qty 100

## 2018-01-24 MED ORDER — GLYCOPYRROLATE 0.2 MG/ML IJ SOLN
INTRAMUSCULAR | Status: DC | PRN
  Administered 2018-01-24: 0.2 mg via INTRAVENOUS

## 2018-01-24 MED ORDER — METOCLOPRAMIDE HCL 5 MG/ML IJ SOLN: 5.0000 mg | Freq: Three times a day (TID) | INTRAMUSCULAR | Status: DC | PRN

## 2018-01-24 MED ORDER — NEOMYCIN-POLYMYXIN B GU 40-200000 IR SOLN
Status: AC
  Filled 2018-01-24: qty 4

## 2018-01-24 MED ORDER — HYDROCODONE-ACETAMINOPHEN 5-325 MG PO TABS
1.0000 | ORAL_TABLET | ORAL | Status: DC | PRN
  Administered 2018-01-24 – 2018-01-25 (×3): 1 via ORAL
  Filled 2018-01-24 (×3): qty 1

## 2018-01-24 MED ORDER — ALUM & MAG HYDROXIDE-SIMETH 200-200-20 MG/5ML PO SUSP: 30.0000 mL | ORAL | Status: DC | PRN

## 2018-01-24 MED ORDER — PHENYLEPHRINE HCL 10 MG/ML IJ SOLN
INTRAMUSCULAR | Status: DC | PRN
  Administered 2018-01-24: 30 ug/min via INTRAVENOUS

## 2018-01-24 MED ORDER — MIDAZOLAM HCL 5 MG/5ML IJ SOLN
INTRAMUSCULAR | Status: DC | PRN
  Administered 2018-01-24: 2 mg via INTRAVENOUS

## 2018-01-24 MED ORDER — LIDOCAINE HCL (PF) 2 % IJ SOLN
INTRAMUSCULAR | Status: DC | PRN
  Administered 2018-01-24: 50 mg

## 2018-01-24 MED ORDER — MORPHINE SULFATE (PF) 2 MG/ML IV SOLN: 0.5000 mg | INTRAVENOUS | Status: DC | PRN

## 2018-01-24 MED ORDER — ONDANSETRON HCL 4 MG/2ML IJ SOLN
INTRAMUSCULAR | Status: DC | PRN
  Administered 2018-01-24: 4 mg via INTRAVENOUS

## 2018-01-24 MED ORDER — ESTRADIOL-NORETHINDRONE ACET 1-0.5 MG PO TABS: 1.0000 | ORAL_TABLET | Freq: Every day | ORAL | Status: DC

## 2018-01-24 MED ORDER — NEOMYCIN-POLYMYXIN B GU 40-200000 IR SOLN
Status: DC | PRN
  Administered 2018-01-24: 4 mL

## 2018-01-24 MED ORDER — PROPOFOL 10 MG/ML IV BOLUS
INTRAVENOUS | Status: AC
  Filled 2018-01-24: qty 40

## 2018-01-24 MED ORDER — PANTOPRAZOLE SODIUM 40 MG PO TBEC
80.0000 mg | DELAYED_RELEASE_TABLET | Freq: Every day | ORAL | Status: DC
  Administered 2018-01-25 – 2018-01-26 (×2): 80 mg via ORAL
  Filled 2018-01-24: qty 2

## 2018-01-24 MED ORDER — BISACODYL 10 MG RE SUPP: 10.0000 mg | Freq: Every day | RECTAL | Status: DC | PRN

## 2018-01-24 MED ORDER — SENNOSIDES-DOCUSATE SODIUM 8.6-50 MG PO TABS
1.0000 | ORAL_TABLET | Freq: Every evening | ORAL | Status: DC | PRN
  Administered 2018-01-24: 1 via ORAL
  Filled 2018-01-24: qty 1

## 2018-01-24 MED ORDER — ENOXAPARIN SODIUM 40 MG/0.4ML ~~LOC~~ SOLN
40.0000 mg | SUBCUTANEOUS | Status: DC
  Administered 2018-01-25 – 2018-01-26 (×2): 40 mg via SUBCUTANEOUS
  Filled 2018-01-24 (×2): qty 0.4

## 2018-01-24 MED ORDER — SODIUM CHLORIDE 0.9 % IV SOLN
INTRAVENOUS | Status: DC
  Administered 2018-01-25: 04:00:00 via INTRAVENOUS

## 2018-01-24 SURGICAL SUPPLY — 55 items
BLADE SAW SAG 18.5X105 (BLADE) ×2 IMPLANT
BNDG COHESIVE 6X5 TAN STRL LF (GAUZE/BANDAGES/DRESSINGS) ×6 IMPLANT
CANISTER SUCT 1200ML W/VALVE (MISCELLANEOUS) ×2 IMPLANT
CHLORAPREP W/TINT 26ML (MISCELLANEOUS) ×2 IMPLANT
DRAPE C-ARM XRAY 36X54 (DRAPES) ×2 IMPLANT
DRAPE INCISE IOBAN 66X60 STRL (DRAPES) IMPLANT
DRAPE POUCH INSTRU U-SHP 10X18 (DRAPES) ×2 IMPLANT
DRAPE SHEET LG 3/4 BI-LAMINATE (DRAPES) ×6 IMPLANT
DRAPE TABLE BACK 80X90 (DRAPES) ×2 IMPLANT
DRESSING SURGICEL FIBRLLR 1X2 (HEMOSTASIS) ×2 IMPLANT
DRSG OPSITE POSTOP 4X8 (GAUZE/BANDAGES/DRESSINGS) ×4 IMPLANT
DRSG SURGICEL FIBRILLAR 1X2 (HEMOSTASIS) ×4
ELECT BLADE 6.5 EXT (BLADE) ×2 IMPLANT
ELECT REM PT RETURN 9FT ADLT (ELECTROSURGICAL) ×2
ELECTRODE REM PT RTRN 9FT ADLT (ELECTROSURGICAL) ×1 IMPLANT
GLOVE BIOGEL PI IND STRL 9 (GLOVE) ×1 IMPLANT
GLOVE BIOGEL PI INDICATOR 9 (GLOVE) ×1
GLOVE SURG SYN 9.0  PF PI (GLOVE) ×2
GLOVE SURG SYN 9.0 PF PI (GLOVE) ×2 IMPLANT
GOWN SRG 2XL LVL 4 RGLN SLV (GOWNS) ×1 IMPLANT
GOWN STRL NON-REIN 2XL LVL4 (GOWNS) ×1
GOWN STRL REUS W/ TWL LRG LVL3 (GOWN DISPOSABLE) ×1 IMPLANT
GOWN STRL REUS W/TWL LRG LVL3 (GOWN DISPOSABLE) ×1
HEAD FEMORAL 28MM SZ S (Head) ×2 IMPLANT
HEMOVAC 400CC 10FR (MISCELLANEOUS) IMPLANT
HOLDER FOLEY CATH W/STRAP (MISCELLANEOUS) ×2 IMPLANT
HOOD PEEL AWAY FLYTE STAYCOOL (MISCELLANEOUS) ×2 IMPLANT
KIT PREVENA INCISION MGT 13 (CANNISTER) ×2 IMPLANT
LINER DUAL MOB 50MM (Liner) ×2 IMPLANT
MAT BLUE FLOOR 46X72 FLO (MISCELLANEOUS) ×2 IMPLANT
NDL SAFETY ECLIPSE 18X1.5 (NEEDLE) ×1 IMPLANT
NEEDLE HYPO 18GX1.5 SHARP (NEEDLE) ×1
NEEDLE SPNL 18GX3.5 QUINCKE PK (NEEDLE) ×2 IMPLANT
NS IRRIG 1000ML POUR BTL (IV SOLUTION) ×2 IMPLANT
PACK HIP COMPR (MISCELLANEOUS) ×2 IMPLANT
SHELL ACETABULAR SZ0 50 DME (Shell) ×2 IMPLANT
SOL PREP PVP 2OZ (MISCELLANEOUS) ×2
SOLUTION PREP PVP 2OZ (MISCELLANEOUS) ×1 IMPLANT
SPONGE DRAIN TRACH 4X4 STRL 2S (GAUZE/BANDAGES/DRESSINGS) ×2 IMPLANT
STAPLER SKIN PROX 35W (STAPLE) ×2 IMPLANT
STEM FEMORAL SZ2 STD COLLARED (Stem) ×2 IMPLANT
STRAP SAFETY 5IN WIDE (MISCELLANEOUS) ×2 IMPLANT
SUT DVC 2 QUILL PDO  T11 36X36 (SUTURE) ×1
SUT DVC 2 QUILL PDO T11 36X36 (SUTURE) ×1 IMPLANT
SUT SILK 0 (SUTURE) ×1
SUT SILK 0 30XBRD TIE 6 (SUTURE) ×1 IMPLANT
SUT V-LOC 90 ABS DVC 3-0 CL (SUTURE) ×2 IMPLANT
SUT VIC AB 1 CT1 36 (SUTURE) ×2 IMPLANT
SYR 20CC LL (SYRINGE) ×2 IMPLANT
SYR 30ML LL (SYRINGE) ×2 IMPLANT
SYR BULB IRRIG 60ML STRL (SYRINGE) ×2 IMPLANT
TAPE MICROFOAM 4IN (TAPE) ×2 IMPLANT
TOWEL OR 17X26 4PK STRL BLUE (TOWEL DISPOSABLE) ×2 IMPLANT
TRAY FOLEY MTR SLVR 16FR STAT (SET/KITS/TRAYS/PACK) ×2 IMPLANT
WND VAC CANISTER 500ML (MISCELLANEOUS) ×2 IMPLANT

## 2018-01-24 NOTE — Transfer of Care (Signed)
Immediate Anesthesia Transfer of Care Note  Patient: Briana Morris  Procedure(s) Performed: TOTAL HIP ARTHROPLASTY ANTERIOR APPROACH (Right Hip)  Patient Location: PACU  Anesthesia Type:Spinal  Level of Consciousness: awake  Airway & Oxygen Therapy: Patient Spontanous Breathing  Post-op Assessment: Report given to RN  Post vital signs: stable  Last Vitals:  Vitals Value Taken Time  BP 93/70 01/24/2018  4:58 PM  Temp 36.3 C 01/24/2018  4:57 PM  Pulse 100 01/24/2018  5:04 PM  Resp 19 01/24/2018  5:04 PM  SpO2 100 % 01/24/2018  5:04 PM  Vitals shown include unvalidated device data.  Last Pain:  Vitals:   01/24/18 1344  PainSc: 0-No pain         Complications: No apparent anesthesia complications

## 2018-01-24 NOTE — Anesthesia Procedure Notes (Signed)
Spinal  Patient location during procedure: OR Staffing Anesthesiologist: Kephart, William K, MD Resident/CRNA: Lysha Schrade, CRNA Performed: resident/CRNA  Preanesthetic Checklist Completed: patient identified, site marked, surgical consent, pre-op evaluation, timeout performed, IV checked, risks and benefits discussed and monitors and equipment checked Spinal Block Patient position: sitting Prep: ChloraPrep and site prepped and draped Patient monitoring: heart rate, continuous pulse ox, blood pressure and cardiac monitor Approach: midline Location: L4-5 Injection technique: single-shot Needle Needle type: Whitacre and Introducer  Needle gauge: 24 G Needle length: 9 cm Additional Notes Negative paresthesia. Negative blood return. Positive free-flowing CSF. Expiration date of kit checked and confirmed. Patient tolerated procedure well, without complications.       

## 2018-01-24 NOTE — Op Note (Signed)
01/24/2018  4:58 PM  PATIENT:  Briana Morris  67 y.o. female  PRE-OPERATIVE DIAGNOSIS:  PRIMARY OSTEOARTHRITIS OF RIGHT HIP  POST-OPERATIVE DIAGNOSIS:  PRIMARY OSTEOARTHRITIS OF RIGHT HIP  PROCEDURE:  Procedure(s): TOTAL HIP ARTHROPLASTY ANTERIOR APPROACH (Right)  SURGEON: Leitha Schuller, MD  ASSISTANTS: Barrington Ellison, RNFA student  ANESTHESIA:   spinal  EBL:  Total I/O In: -  Out: 1000 [Urine:150; Blood:850]  BLOOD ADMINISTERED:none  DRAINS: none   LOCAL MEDICATIONS USED:  MARCAINE     SPECIMEN:  Source of Specimen:  Right femoral head  DISPOSITION OF SPECIMEN:  PATHOLOGY  COUNTS:  YES  TOURNIQUET:  * No tourniquets in log *  IMPLANTS: Medacta AMIS 2 standard stem with 50 mm Mpact TM cup and liner, 28 mm ceramic head eyes S  DICTATION: .Dragon Dictation   The patient was brought to the operating room and after spinal anesthesia was obtained patient was placed on the operative table with the ipsilateral foot into the Medacta attachment, contralateral leg on a well-padded table. C-arm was brought in and preop template x-ray taken. After prepping and draping in usual sterile fashion appropriate patient identification and timeout procedures were completed. Anterior approach to the hip was obtained and centered over the greater trochanter and TFL muscle. The subcutaneous tissue was incised hemostasis being achieved by electrocautery. TFL fascia was incised and the muscle retracted laterally deep retractor placed. The lateral femoral circumflex vessels were identified and ligated. The anterior capsule was exposed and a capsulotomy performed. The neck was identified and a femoral neck cut carried out with a saw.  Large loose body was present within the joint capsule.  The head was removed without difficulty and showed sclerotic femoral head and acetabulum. Reaming was carried out to 48 mm and a 50 mm cup trial gave appropriate tightness to the acetabular component a 50 mm  DM cup was impacted into position. The leg was then externally rotated and ischiofemoral and pubofemoral releases carried out. The femur was sequentially broached to a size 2, size 2 standard with as had trials were placed and the final components chosen. The 2 standard stem was inserted along with a ceramic S 28 mm head and 50 mm liner. The hip was reduced and was stable the wound was thoroughly irrigated with fibrillar placed on the medial neck cut and posterior capsule incision area the deep fascia was closed using a heavy Quill after infiltration of 30 cc of quarter percent Sensorcaine with epinephrine. 3-0 v-loc subcutaneous closure followed by skin staples and incisional wound VAC  PLAN OF CARE: Admit to inpatient

## 2018-01-24 NOTE — Anesthesia Post-op Follow-up Note (Signed)
Anesthesia QCDR form completed.        

## 2018-01-24 NOTE — Progress Notes (Signed)
Pt A&OX4. VSS. Pt can wiggle toes and has sensation, pedal pulses intact. Pt oriented to unit and admission complete. Bedside report completed with night shift RN.

## 2018-01-24 NOTE — H&P (Signed)
Reviewed paper H+P, will be scanned into chart. No changes noted.  

## 2018-01-24 NOTE — Anesthesia Preprocedure Evaluation (Signed)
Anesthesia Evaluation  Patient identified by MRN, date of birth, ID band Patient awake    Reviewed: Allergy & Precautions, H&P , NPO status , Patient's Chart, lab work & pertinent test results, reviewed documented beta blocker date and time   History of Anesthesia Complications (+) PONV and history of anesthetic complications  Airway Mallampati: II   Neck ROM: full    Dental  (+) Poor Dentition   Pulmonary neg pulmonary ROS, former smoker,    Pulmonary exam normal        Cardiovascular Exercise Tolerance: Poor negative cardio ROS Normal cardiovascular exam Rhythm:regular Rate:Normal     Neuro/Psych negative neurological ROS  negative psych ROS   GI/Hepatic negative GI ROS, Neg liver ROS, GERD  Medicated,  Endo/Other  negative endocrine ROS  Renal/GU negative Renal ROS  negative genitourinary   Musculoskeletal   Abdominal   Peds  Hematology negative hematology ROS (+)   Anesthesia Other Findings Past Medical History: No date: Arthritis No date: GERD (gastroesophageal reflux disease) No date: Hemorrhoids     Comment:  a. Internal/External. 2008: Hypercholesteremia No date: PONV (postoperative nausea and vomiting)     Comment:  nausea no vomiting Past Surgical History: 1982: AUGMENTATION MAMMAPLASTY; Bilateral 2013: SHOULDER ARTHROSCOPY W/ ROTATOR CUFF REPAIR; Right 05/03/2017: TOTAL HIP ARTHROPLASTY; Left     Comment:  Procedure: TOTAL HIP ARTHROPLASTY ANTERIOR APPROACH;                Surgeon: Kennedy Bucker, MD;  Location: [facility] ORS;                Service: Orthopedics;  Laterality: Left; 1983: TUBAL LIGATION BMI    Body Mass Index:  28.17 kg/m     Reproductive/Obstetrics negative OB ROS                             Anesthesia Physical Anesthesia Plan  ASA: III  Anesthesia Plan: General and Spinal   Post-op Pain Management:    Induction:   PONV Risk Score and Plan:    Airway Management Planned:   Additional Equipment:   Intra-op Plan:   Post-operative Plan:   Informed Consent: I have reviewed the patients History and Physical, chart, labs and discussed the procedure including the risks, benefits and alternatives for the proposed anesthesia with the patient or authorized representative who has indicated his/her understanding and acceptance.   Dental Advisory Given  Plan Discussed with: CRNA  Anesthesia Plan Comments:         Anesthesia Quick Evaluation

## 2018-01-25 ENCOUNTER — Encounter: Payer: Self-pay | Admitting: Orthopedic Surgery

## 2018-01-25 MED ORDER — MAGNESIUM HYDROXIDE 400 MG/5ML PO SUSP
30.0000 mL | Freq: Every day | ORAL | Status: DC | PRN
  Administered 2018-01-25 – 2018-01-26 (×2): 30 mL via ORAL
  Filled 2018-01-25 (×2): qty 30

## 2018-01-25 MED ORDER — ACETAMINOPHEN 500 MG PO TABS
1000.0000 mg | ORAL_TABLET | Freq: Four times a day (QID) | ORAL | Status: DC | PRN
  Administered 2018-01-25 (×2): 1000 mg via ORAL
  Filled 2018-01-25: qty 2

## 2018-01-25 MED ORDER — ACETAMINOPHEN 500 MG PO TABS
500.0000 mg | ORAL_TABLET | Freq: Four times a day (QID) | ORAL | Status: DC | PRN
  Filled 2018-01-25: qty 1

## 2018-01-25 NOTE — Progress Notes (Addendum)
Pt alert and oriented. No post-op nausea noted. Pt did not dangle because she stated she was dizzy. woundvac dsg dry and intact. Ice pack go right hip. Instructed in incentive spirometer use. Staff will continue to monitor. Vital signs stable.

## 2018-01-25 NOTE — Progress Notes (Signed)
Clinical Social Worker (CSW) received SNF consult. PT is recommending home health. RN case manager aware of above. Please reconsult if future social work needs arise. CSW signing off.   Glenna Brunkow, LCSW [phone] 

## 2018-01-25 NOTE — Progress Notes (Signed)
   Subjective: 1 Day Post-Op Procedure(s) (LRB): TOTAL HIP ARTHROPLASTY ANTERIOR APPROACH (Right) Patient reports pain as 4 on 0-10 scale.   Patient is well, and has had no acute complaints or problems Denies any CP, SOB, ABD pain. We will continue therapy today.  Plan is to go Home after hospital stay.  Objective: Vital signs in last 24 hours: Temp:  [97 F (36.1 C)-99.5 F (37.5 C)] 99.5 F (37.5 C) (05/22 0410) Pulse Rate:  [82-109] 92 (05/22 0410) Resp:  [12-20] 18 (05/22 0410) BP: (92-148)/(64-108) 121/71 (05/22 0410) SpO2:  [97 %-100 %] 99 % (05/22 0410) Weight:  [69.9 kg (154 lb)] 69.9 kg (154 lb) (05/21 1344)  Intake/Output from previous day: 05/21 0701 - 05/22 0700 In: 1750 [I.V.:1750] Out: 1950 [Urine:1100; Blood:850] Intake/Output this shift: No intake/output data recorded.  Recent Labs    01/24/18 1830  HGB 12.2   Recent Labs    01/24/18 1830  WBC 12.1*  RBC 3.95  HCT 35.9  PLT 168   Recent Labs    01/24/18 1830  CREATININE 0.78   No results for input(s): LABPT, INR in the last 72 hours.  EXAM General - Patient is Alert, Appropriate and Oriented Extremity - Neurovascular intact Sensation intact distally Intact pulses distally Dorsiflexion/Plantar flexion intact No cellulitis present Compartment soft Dressing - dressing C/D/I and no drainage Motor Function - intact, moving foot and toes well on exam.   Past Medical History:  Diagnosis Date  . Arthritis   . GERD (gastroesophageal reflux disease)   . Hemorrhoids    a. Internal/External.  . Hypercholesteremia 2008  . PONV (postoperative nausea and vomiting)    nausea no vomiting    Assessment/Plan:   1 Day Post-Op Procedure(s) (LRB): TOTAL HIP ARTHROPLASTY ANTERIOR APPROACH (Right) Active Problems:   Primary localized osteoarthritis of right hip  Estimated body mass index is 28.17 kg/m as calculated from the following:   Height as of this encounter:  (1.575 m).   Weight as  of this encounter: 69.9 kg (154 lb). Advance diet Up with therapy  Needs BM Recheck labs in the am CM to assist with discharge    DVT Prophylaxis - Lovenox, Foot Pumps and TED hose Weight-Bearing as tolerated to right leg   T. Cranston Neighbor, PA-C [facility] 01/25/2018, 8:13 AM

## 2018-01-25 NOTE — [place] FL2 (Signed)
[place] MEDICAID FL2 LEVEL OF CARE SCREENING TOOL     IDENTIFICATION  Patient Name: Layaan Mott Birthdate: 21-Apr-1951 Sex: female Admission Date (Current Location): 01/24/2018  [place] and [place] Number:  Chiropodist and Address:  [facility], [address] [place], [place] [address]      Provider Number: [id number]  Attending Physician Name and Address:  Kennedy Bucker, MD  Relative Name and Phone Number:       Current Level of Care: Hospital Recommended Level of Care: Skilled Nursing Facility Prior Approval Number:    Date Approved/Denied:   PASRR Number: [id number])  Discharge Plan: SNF    Current Diagnoses: Patient Active Problem List   Diagnosis Date Noted  . Primary localized osteoarthritis of right hip 01/24/2018  . Primary osteoarthritis of left hip 05/03/2017    Orientation RESPIRATION BLADDER Height & Weight     Self, Time, Situation, Place  Normal Continent Weight: 154 lb (69.9 kg) Height:   (157.5 cm)  BEHAVIORAL SYMPTOMS/MOOD NEUROLOGICAL BOWEL NUTRITION STATUS      Continent Diet(Diet: Regular )  AMBULATORY STATUS COMMUNICATION OF NEEDS Skin   Extensive Assist Verbally Surgical wounds, Wound Vac(Incision: Right Hip. Provena wound vac. )                       Personal Care Assistance Level of Assistance  Bathing, Feeding, Dressing Bathing Assistance: Limited assistance Feeding assistance: Independent Dressing Assistance: Limited assistance     Functional Limitations Info  Sight, Hearing, Speech Sight Info: Adequate Hearing Info: Adequate Speech Info: Adequate    SPECIAL CARE FACTORS FREQUENCY  PT (By licensed PT), OT (By licensed OT)     PT Frequency: (5) OT Frequency: (5)            Contractures      Additional Factors Info  Code Status, Allergies Code Status Info: (Full Code. ) Allergies Info: (No Known Allergies. )           Current Medications (01/25/2018):   This is the current hospital active medication list Current Facility-Administered Medications  Medication Dose Route Frequency Provider Last Rate Last Dose  . 0.9 %  sodium chloride infusion   Intravenous Continuous Kennedy Bucker, MD 100 mL/hr at 01/25/18 0356    . acetaminophen (TYLENOL) tablet 1,000 mg  1,000 mg Oral Q6H PRN Poggi, Excell Seltzer, MD   1,000 mg at 01/25/18 0352  . acetaminophen (TYLENOL) tablet 325-650 mg  325-650 mg Oral Q6H PRN Kennedy Bucker, MD      . alum & mag hydroxide-simeth (MAALOX/MYLANTA) 200-200-20 MG/5ML suspension 30 mL  30 mL Oral Q4H PRN Kennedy Bucker, MD      . aspirin EC tablet 81 mg  81 mg Oral QHS Kennedy Bucker, MD      . bisacodyl (DULCOLAX) suppository 10 mg  10 mg Rectal Daily PRN Kennedy Bucker, MD      . ceFAZolin (ANCEF) IVPB 2g/100 mL premix  2 g Intravenous Q6H Kennedy Bucker, MD   Stopped at 01/25/18 0423  . diphenhydrAMINE (BENADRYL) 12.5 MG/5ML elixir 12.5-25 mg  12.5-25 mg Oral Q4H PRN Kennedy Bucker, MD      . diphenhydrAMINE (BENADRYL) capsule 25 mg  25 mg Oral QHS Kennedy Bucker, MD   25 mg at 01/24/18 2058  . docusate sodium (COLACE) capsule 100 mg  100 mg Oral BID Kennedy Bucker, MD   100 mg at 01/24/18 2034  . enoxaparin (LOVENOX) injection 40 mg  40 mg Subcutaneous Q24H Kennedy Bucker, MD   40 mg at 01/25/18 0739  . estradiol-norethindrone (ACTIVELLA) 1-0.5 MG per tablet 1 tablet  1 tablet Oral Daily Kennedy Bucker, MD      . gabapentin (NEURONTIN) capsule 300 mg  300 mg Oral TID Kennedy Bucker, MD   300 mg at 01/24/18 2034  . HYDROcodone-acetaminophen (NORCO) 7.5-325 MG per tablet 1-2 tablet  1-2 tablet Oral Q4H PRN Kennedy Bucker, MD      . HYDROcodone-acetaminophen (NORCO/VICODIN) 5-325 MG per tablet 1-2 tablet  1-2 tablet Oral Q4H PRN Kennedy Bucker, MD   1 tablet at 01/24/18 2038  . loratadine (CLARITIN) tablet 10 mg  10 mg Oral Daily Kennedy Bucker, MD      . magnesium citrate solution 1 Bottle  1 Bottle Oral Once PRN Kennedy Bucker, MD      . magnesium  hydroxide (MILK OF MAGNESIA) suspension 30 mL  30 mL Oral Daily PRN Kennedy Bucker, MD      . menthol-cetylpyridinium (CEPACOL) lozenge 3 mg  1 lozenge Oral PRN Kennedy Bucker, MD       Or  . phenol (CHLORASEPTIC) mouth spray 1 spray  1 spray Mouth/Throat PRN Kennedy Bucker, MD      . methocarbamol (ROBAXIN) tablet 500 mg  500 mg Oral Q6H PRN Kennedy Bucker, MD   500 mg at 01/24/18 2033   Or  . methocarbamol (ROBAXIN) 500 mg in dextrose 5 % 50 mL IVPB  500 mg Intravenous Q6H PRN Kennedy Bucker, MD      . metoCLOPramide (REGLAN) tablet 5-10 mg  5-10 mg Oral Q8H PRN Kennedy Bucker, MD       Or  . metoCLOPramide (REGLAN) injection 5-10 mg  5-10 mg Intravenous Q8H PRN Kennedy Bucker, MD      . morphine 2 MG/ML injection 0.5-1 mg  0.5-1 mg Intravenous Q2H PRN Kennedy Bucker, MD      . multivitamin with minerals tablet 1 tablet  1 tablet Oral Daily Kennedy Bucker, MD      . ondansetron [facility]) tablet 4 mg  4 mg Oral Q6H PRN Kennedy Bucker, MD       Or  . ondansetron [facility]) injection 4 mg  4 mg Intravenous Q6H PRN Kennedy Bucker, MD      . pantoprazole (PROTONIX) EC tablet 80 mg  80 mg Oral Daily Kennedy Bucker, MD      . pneumococcal 23 valent vaccine (PNU-IMMUNE) injection 0.5 mL  0.5 mL Intramuscular Tomorrow-1000 Kennedy Bucker, MD      . senna-docusate (Senokot-S) tablet 1 tablet  1 tablet Oral QHS PRN Kennedy Bucker, MD   1 tablet at 01/24/18 2033  . simvastatin (ZOCOR) tablet 40 mg  40 mg Oral Daily Kennedy Bucker, MD      . traMADol Janean Sark) tablet 50 mg  50 mg Oral Q6H Kennedy Bucker, MD   50 mg at 01/25/18 0739  . venlafaxine XR (EFFEXOR-XR) 24 hr capsule 75 mg  75 mg Oral Daily Kennedy Bucker, MD      . zolpidem [facility]) tablet 5 mg  5 mg Oral QHS PRN Kennedy Bucker, MD         Discharge Medications: Please see discharge summary for a list of discharge medications.  Relevant Imaging Results:  Relevant Lab Results:   Additional Information (SSN: [id number])  Calypso Hagarty, Darleen Crocker, LCSW

## 2018-01-25 NOTE — Evaluation (Signed)
Occupational Therapy Evaluation Patient Details Name: Briana Morris MRN: [id number] DOB: [birth year] Today's Date: 01/25/2018    History of Present Illness Patient is a 67 year old female admitted s/p R anterior THA. PMH includes L THA (approx. 1 year ago according to pt), PONV, hypercholestroemia and arthritis.   Clinical Impression   Pt seen for OT evaluation this date, POD#1 from above surgery (RLE WBAT, anterior approach). Pt lives with her spouse in a 2 story home with bed/bath on the main floor and 4 steps to enter/exit the home with L handrail and wall on R side. Pt was independent in all ADLs prior to surgery, regularly exercising, and kayaking with her spouse. Pt is eager to return to PLOF with less pain and improved safety and independence. Pt reports previous hip recovery went well, as did her spouse's some time before her first surgery. Pt reports having AE, BSC, 2WW, and shower chair from previous surgery. Pt instructed in AE for LB ADL, compression stocking mgt, home/routines modifications, and falls prevention strategies to maximize pt's safety and independence with ADL and functional mobility. Pt able to perform ADL with modified independence and supervision during transitional movements and OOB mobility with RW, with good safety awareness. All education/training provided, no additional skilled OT needs at this time. Will sign off. Please re-consult if additional needs arise.    Follow Up Recommendations  No OT follow up    Equipment Recommendations  None recommended by OT    Recommendations for Other Services       Precautions / Restrictions Precautions Precautions: Fall;Anterior Hip Precaution Booklet Issued: No Restrictions Weight Bearing Restrictions: Yes RLE Weight Bearing: Weight bearing as tolerated      Mobility Bed Mobility            General bed mobility comments: deferred, pt up in recliner  Transfers Overall transfer level: Needs  assistance Equipment used: Rolling walker (2 wheeled) Transfers: Sit to/from Stand Sit to Stand: Supervision         General transfer comment: Min VC's for management of RW.    Balance Overall balance assessment: Modified Independent                                         ADL either performed or assessed with clinical judgement   ADL Overall ADL's : Modified independent                                       General ADL Comments: Pt able to perform LB bathing and dressing tasks with AE (following instruction) with modified indepence. Supervision level for mobility with RW with good safety awareness.      Vision Baseline Vision/History: Wears glasses Wears Glasses: At all times Patient Visual Report: No change from baseline Vision Assessment?: No apparent visual deficits     Perception     Praxis      Pertinent Vitals/Pain Pain Assessment: 0-10 Pain Score: 3  Pain Location: R hip Pain Descriptors / Indicators: Aching Pain Intervention(s): Monitored during session;Premedicated before session     Hand Dominance     Extremity/Trunk Assessment Upper Extremity Assessment Upper Extremity Assessment: Overall WFL for tasks assessed   Lower Extremity Assessment Lower Extremity Assessment: Overall WFL for tasks assessed(expected post-op RLE strength/ROM deficits) RLE Deficits / Details: Ankle  PF 4/5 RLE: Unable to fully assess due to pain RLE Sensation: WNL RLE Coordination: WNL   Cervical / Trunk Assessment Cervical / Trunk Assessment: Normal   Communication Communication Communication: No difficulties   Cognition Arousal/Alertness: Awake/alert Behavior During Therapy: WFL for tasks assessed/performed Overall Cognitive Status: Within Functional Limits for tasks assessed                                 General Comments: A&O x4.  follows all commands consistently.   General Comments       Exercises Other  Exercises Other Exercises: Pt instructed in compression stocking mgt strategies and falls prevention as well as AE for LB ADL   Shoulder Instructions      Home Living Family/patient expects to be discharged to:: Private residence Living Arrangements: Spouse/significant other Available Help at Discharge: Family;Available 24 hours/day Type of Home: House Home Access: Stairs to enter [employer] of Steps: 4 Entrance Stairs-Rails: Left(wall on R) Home Layout: Two level;Able to live on main level with bedroom/bathroom     Bathroom Shower/Tub: Chief Strategy Officer: Standard Bathroom Accessibility: Yes   Home Equipment: Walker - 2 wheels;Bedside commode;Shower seat;Hand held shower head;Adaptive equipment Adaptive Equipment: Reacher;Sock aid;Long-handled shoe horn        Prior Functioning/Environment Level of Independence: Independent        Comments: Able to drive to get groceries. Stayed active, regularly participating in exercise programs and enjoys going kayaking with her spouse.        OT Problem List:        OT Treatment/Interventions:      OT Goals(Current goals can be found in the care plan section) Acute Rehab OT Goals Patient Stated Goal: To return home and back to generally active lifestyle.  To continue working with exercise group. OT Goal Formulation: All assessment and education complete, DC therapy  OT Frequency:     Barriers to D/C:            Co-evaluation              AM-PAC PT "6 Clicks" Daily Activity     Outcome Measure Help from another person eating meals?: None Help from another person taking care of personal grooming?: None Help from another person toileting, which includes using toliet, bedpan, or urinal?: None Help from another person bathing (including washing, rinsing, drying)?: None Help from another person to put on and taking off regular upper body clothing?: None Help from another person to put on and  taking off regular lower body clothing?: A Little 6 Click Score: 23   End of Session    Activity Tolerance: Patient tolerated treatment well Patient left: in chair;with call bell/phone within reach;with chair alarm set;with SCD's reapplied  OT Visit Diagnosis: Other abnormalities of gait and mobility (R26.89)                Time: 1610-9604 OT Time Calculation (min): 9 min Charges:  OT General Charges $OT Visit: 1 Visit OT Evaluation $OT Eval Low Complexity: 1 Low  Richrd Prime, MPH, MS, OTR/L ascom [phone] 01/25/18, 11:44 AM

## 2018-01-25 NOTE — Care Management (Signed)
RNCM again met with patient regarding cost of Lovenox $76.40 and patient agrees. She is still deciding on home health agency.

## 2018-01-25 NOTE — Progress Notes (Signed)
Physical Therapy Treatment Patient Details Name: Briana Morris MRN: [id number] DOB: [birth year] Today's Date: 01/25/2018    History of Present Illness Patient is a 67 year old female admitted s/p R anterior THA. PMH includes L THA (approx. 1 year ago according to pt), PONV, hypercholestroemia and arthritis.    PT Comments    Patient in bed upon PT arrival and able to perform bed mobility with min A.  PT issued HEP and reviewed anterior hip precautions and there ex.  Pt able to complete all exercises with some manual assistance for initiation of movement.  PT reviewed possible functional activities which might place pt to break hip precautions and pt expressed understanding.  Pt was able to perform STS supervision with good management of RW.  She ambulated 80 ft with RW demonstrating mild gait deviations and reporting pain increase to 9/10 following.  RN notified. Pt will continue to benefit from skilled PT with focus on strength, tolerance to activity, gait with RW and stair negotiation.  Follow Up Recommendations  Home health PT     Equipment Recommendations  None recommended by PT    Recommendations for Other Services       Precautions / Restrictions Precautions Precautions: Fall;Anterior Hip Precaution Booklet Issued: No Restrictions Weight Bearing Restrictions: Yes RLE Weight Bearing: Weight bearing as tolerated    Mobility  Bed Mobility Overal bed mobility: Needs Assistance Bed Mobility: Supine to Sit     Supine to sit: Min assist     General bed mobility comments: Assistance in bringing R LE over EOB.  Transfers Overall transfer level: Needs assistance Equipment used: Rolling walker (2 wheeled) Transfers: Sit to/from Stand Sit to Stand: Supervision         General transfer comment: Able to stand without physical assist.  Proper use of RW demonstrated.  Ambulation/Gait Ambulation/Gait assistance: Min guard Ambulation Distance (Feet): 80 Feet Assistive  device: Rolling walker (2 wheeled)     Gait velocity interpretation: <1.8 ft/sec, indicate of risk for recurrent falls General Gait Details: Step through gait pattern with proper use of RW, antalgic gait with report of increased pain halfway through.   Stairs             Wheelchair Mobility    Modified Rankin (Stroke Patients Only)       Balance Overall balance assessment: Modified Independent                                          Cognition Arousal/Alertness: Awake/alert Behavior During Therapy: WFL for tasks assessed/performed Overall Cognitive Status: Within Functional Limits for tasks assessed                                 General Comments: A&O x4.  follows all commands consistently.      Exercises Total Joint Exercises Ankle Circles/Pumps: AROM;Both;Supine;20 reps Quad Sets: Strengthening;Right;10 reps;Supine Gluteal Sets: Strengthening;Both;Supine;10 reps Towel Squeeze: Strengthening;Both;10 reps;Supine Short Arc Quad: Strengthening;Right;10 reps;Supine Heel Slides: AAROM;Right;10 reps;Supine Hip ABduction/ADduction: AROM;Right;10 reps;Supine Straight Leg Raises: AAROM;Right;10 reps;Supine Other Exercises Other Exercises: Issued and reviewed HEP for LE strengthening and answered all questions.    General Comments        Pertinent Vitals/Pain Pain Assessment: 0-10 Pain Score: 9  Pain Location: R hip Pain Descriptors / Indicators: Aching Pain Intervention(s): Limited activity within patient's tolerance;Monitored  during session;Patient requesting pain meds-RN notified    Home Living Family/patient expects to be discharged to:: Private residence Living Arrangements: Spouse/significant other Available Help at Discharge: Family;Available 24 hours/day Type of Home: House Home Access: Stairs to enter Entrance Stairs-Rails: Left(wall on R) Home Layout: Two level;Able to live on main level with bedroom/bathroom Home  Equipment: Dan Humphreys - 2 wheels;Bedside commode;Shower seat;Hand held shower head;Adaptive equipment      Prior Function Level of Independence: Independent      Comments: Able to drive to get groceries. Stayed active, regularly participating in exercise programs and enjoys going kayaking with her spouse.   PT Goals (current goals can now be found in the care plan section) Acute Rehab PT Goals Patient Stated Goal: To return home and back to generally active lifestyle.  To continue working with exercise group. PT Goal Formulation: With patient Time For Goal Achievement: 02/08/18 Potential to Achieve Goals: Good Progress towards PT goals: Progressing toward goals    Frequency    BID      PT Plan Current plan remains appropriate    Co-evaluation              AM-PAC PT "6 Clicks" Daily Activity  Outcome Measure  Difficulty turning over in bed (including adjusting bedclothes, sheets and blankets)?: A Little Difficulty moving from lying on back to sitting on the side of the bed? : A Little Difficulty sitting down on and standing up from a chair with arms (e.g., wheelchair, bedside commode, etc,.)?: A Little Help needed moving to and from a bed to chair (including a wheelchair)?: A Little Help needed walking in hospital room?: A Little Help needed climbing 3-5 steps with a railing? : A Little 6 Click Score: 18    End of Session Equipment Utilized During Treatment: Gait belt Activity Tolerance: Patient tolerated treatment well;Patient limited by pain Patient left: with bed alarm set;in bed;with family/visitor present;with call bell/phone within reach Nurse Communication: Mobility status;Patient requests pain meds PT Visit Diagnosis: Unsteadiness on feet (R26.81);Muscle weakness (generalized) (M62.81);Pain Pain - Right/Left: Right Pain - part of body: Hip     Time: 1415-1440 PT Time Calculation (min) (ACUTE ONLY): 25 min  Charges:  $Therapeutic Exercise: 23-37 mins                     G Codes:  Functional Assessment Tool Used: AM-PAC 6 Clicks Basic Mobility    Glenetta Hew, PT, DPT    Glenetta Hew 01/25/2018, 3:03 PM

## 2018-01-25 NOTE — Evaluation (Signed)
Physical Therapy Evaluation Patient Details Name: Briana Morris MRN: [id number] DOB: 19-May-1951 Today's Date: 01/25/2018   History of Present Illness  Patient is a 67 year old female admitted s/p R anterior THA. PMH includes L THA (approx. 1 year ago according to pt), PONV, hypercholestroemia and arthritis.  Clinical Impression  Patient is a 67 year old female who lives with her husband in a two story home that has 4 steps to enter/exit.  She is independent without AD at baseline and participates in a regular exercise program.  Pt is in bed and reports 4/10 pain upon PT arrival.  She is able to perform bed mobility with min A for initiation of movement of R LE and sit at EOB without assistance.  PT reviewed ant hip precautions and management of WB on R LE and pt was able to demonstrate understanding.  She presented with overall good strength and MMT of RLE was limited due to pain.  Ambulated 40 ft in room with RW and mild gait deviations.  Pt reported mild weakness following and BP was WNL in sitting position.  Pt recovered within 1 min of sitting.  PT reviewed there ex and instructed pt concerning frequency of performance and pt was able to demonstrate understanding.  No pain increase reported at end of session.  Pt will continue to benefit from skilled PT with focus on strength, tolerance to activity, pain management, functional mobility and proper use of AD.    Follow Up Recommendations Home health PT    Equipment Recommendations  None recommended by PT    Recommendations for Other Services       Precautions / Restrictions Precautions Precautions: Fall;Anterior Hip Precaution Booklet Issued: No Restrictions Weight Bearing Restrictions: Yes RLE Weight Bearing: Weight bearing as tolerated      Mobility  Bed Mobility Overal bed mobility: Needs Assistance Bed Mobility: Supine to Sit     Supine to sit: Min assist     General bed mobility comments: Assistance to initiate  movement of R LE to EOB due to pt reported "soreness".  Transfers Overall transfer level: Needs assistance Equipment used: Rolling walker (2 wheeled) Transfers: Sit to/from Stand Sit to Stand: Supervision         General transfer comment: Min VC's for management of RW.  Ambulation/Gait Ambulation/Gait assistance: Supervision Ambulation Distance (Feet): 40 Feet Assistive device: Rolling walker (2 wheeled)     Gait velocity interpretation: <1.8 ft/sec, indicate of risk for recurrent falls General Gait Details: Slow step to gait with decreased stance time on R LE, good management of RW with VC's for sequencing during turns.  Provided reminder regarding WBAT management and pt was able to demonstrate moderate wt shift.  Stairs            Wheelchair Mobility    Modified Rankin (Stroke Patients Only)       Balance Overall balance assessment: Modified Independent                                           Pertinent Vitals/Pain Pain Assessment: 0-10 Pain Score: 4  Pain Location: R hip Pain Descriptors / Indicators: Aching Pain Intervention(s): Limited activity within patient's tolerance;Monitored during session    Home Living Family/patient expects to be discharged to:: Private residence Living Arrangements: Spouse/significant other Available Help at Discharge: Family;Available 24 hours/day Type of Home: House Home Access: Stairs to  enter Entrance Stairs-Rails: Left Entrance Stairs-Number of Steps: 4 Home Layout: Two level;Able to live on main level with bedroom/bathroom Home Equipment: Walker - 2 wheels      Prior Function Level of Independence: Independent         Comments: Able to drive to get groceries.     Hand Dominance        Extremity/Trunk Assessment   Upper Extremity Assessment Upper Extremity Assessment: Overall WFL for tasks assessed(Grossly 4/5 bilaterally)    Lower Extremity Assessment Lower Extremity Assessment:  Overall WFL for tasks assessed;RLE deficits/detail RLE Deficits / Details: Ankle PF 4/5 RLE: Unable to fully assess due to pain RLE Sensation: WNL RLE Coordination: WNL    Cervical / Trunk Assessment Cervical / Trunk Assessment: Normal  Communication   Communication: No difficulties  Cognition Arousal/Alertness: Awake/alert Behavior During Therapy: WFL for tasks assessed/performed Overall Cognitive Status: Within Functional Limits for tasks assessed                                 General Comments: A&O x4.  follows all commands consistently.      General Comments      Exercises Total Joint Exercises Ankle Circles/Pumps: AROM;10 reps;Seated;Both Quad Sets: Strengthening;Right;5 reps;Seated Gluteal Sets: Strengthening;Both;5 reps;Seated Other Exercises Other Exercises: Provided education concerning anterior hip precautions and pt expressed understanding.   Assessment/Plan    PT Assessment Patient needs continued PT services  PT Problem List Decreased strength;Decreased mobility;Decreased balance;Decreased knowledge of use of DME;Decreased activity tolerance;Decreased range of motion       PT Treatment Interventions DME instruction;Therapeutic activities;Gait training;Therapeutic exercise;Patient/family education;Stair training;Balance training;Functional mobility training;Neuromuscular re-education    PT Goals (Current goals can be found in the Care Plan section)  Acute Rehab PT Goals Patient Stated Goal: To return home and back to generally active lifestyle.  To continue working with exercise group. PT Goal Formulation: With patient Time For Goal Achievement: 02/08/18 Potential to Achieve Goals: Good    Frequency BID   Barriers to discharge        Co-evaluation               AM-PAC PT "6 Clicks" Daily Activity  Outcome Measure Difficulty turning over in bed (including adjusting bedclothes, sheets and blankets)?: A Little Difficulty moving  from lying on back to sitting on the side of the bed? : A Little Difficulty sitting down on and standing up from a chair with arms (e.g., wheelchair, bedside commode, etc,.)?: A Little Help needed moving to and from a bed to chair (including a wheelchair)?: A Little Help needed walking in hospital room?: A Little Help needed climbing 3-5 steps with a railing? : A Little 6 Click Score: 18    End of Session Equipment Utilized During Treatment: Gait belt Activity Tolerance: Patient tolerated treatment well Patient left: in chair;with call bell/phone within reach;with chair alarm set Nurse Communication: Mobility status PT Visit Diagnosis: Unsteadiness on feet (R26.81);Muscle weakness (generalized) (M62.81);Pain Pain - Right/Left: Right Pain - part of body: Hip    Time: 0905-0930 PT Time Calculation (min) (ACUTE ONLY): 25 min   Charges:   PT Evaluation $PT Eval Low Complexity: 1 Low PT Treatments $Therapeutic Exercise: 8-22 mins   PT G Codes:   PT G-Codes **NOT FOR INPATIENT CLASS** Functional Assessment Tool Used: AM-PAC 6 Clicks Basic Mobility    Glenetta Hew, PT, DPT   Glenetta Hew 01/25/2018, 9:53 AM

## 2018-01-25 NOTE — Care Management (Signed)
PT contacted this RNCM per patient request for home health through [facility]. I have notified Barbara Cower with [facility].

## 2018-01-25 NOTE — Care Management (Addendum)
RNCM attempted to meet with patient regarding transition of care but she was eating her breakfast.  She would like to return home at discharge. She will need a rolling walker which has been requested from [facility]. List of home health agencies left at bedside for patient review. She uses [facility] for medications. [phone]. Lovenox  injection daily for 14-days no refills called in to [facility] per Dr. Kennedy Bucker. RNCM will follow up with patient. PT evaluation pending. Update at 1030A- patient does have a walker and she plans to return home with her husband at discharge. She is waiting to review home health list with her husband.  Lovenox cost $76.40.

## 2018-01-26 LAB — CBC
HCT: 30.7 % — ABNORMAL LOW (ref 35.0–47.0)
Hemoglobin: 10.8 g/dL — ABNORMAL LOW (ref 12.0–16.0)
MCH: 32.2 pg (ref 26.0–34.0)
MCHC: 35.3 g/dL (ref 32.0–36.0)
MCV: 91.2 fL (ref 80.0–100.0)
PLATELETS: 133 10*3/uL — AB (ref 150–440)
RBC: 3.36 MIL/uL — AB (ref 3.80–5.20)
RDW: 13 % (ref 11.5–14.5)
WBC: 10.8 10*3/uL (ref 3.6–11.0)

## 2018-01-26 LAB — BASIC METABOLIC PANEL
Anion gap: 5 (ref 5–15)
BUN: 13 mg/dL (ref 6–20)
CALCIUM: 8.3 mg/dL — AB (ref 8.9–10.3)
CO2: 29 mmol/L (ref 22–32)
Chloride: 105 mmol/L (ref 101–111)
Creatinine, Ser: 0.64 mg/dL (ref 0.44–1.00)
Glucose, Bld: 132 mg/dL — ABNORMAL HIGH (ref 65–99)
POTASSIUM: 3.7 mmol/L (ref 3.5–5.1)
Sodium: 139 mmol/L (ref 135–145)

## 2018-01-26 LAB — SURGICAL PATHOLOGY

## 2018-01-26 MED ORDER — HYDROCODONE-ACETAMINOPHEN 7.5-325 MG PO TABS: 1.0000 | ORAL_TABLET | ORAL | 0 refills | Status: DC | PRN

## 2018-01-26 MED ORDER — ENOXAPARIN SODIUM 40 MG/0.4ML ~~LOC~~ SOLN: 40.0000 mg | SUBCUTANEOUS | 0 refills | Status: DC

## 2018-01-26 MED ORDER — DOCUSATE SODIUM 100 MG PO CAPS: 100.0000 mg | ORAL_CAPSULE | Freq: Two times a day (BID) | ORAL | 0 refills | Status: AC

## 2018-01-26 NOTE — Discharge Instructions (Signed)
ANTERIOR APPROACH TOTAL HIP REPLACEMENT POSTOPERATIVE DIRECTIONS   Hip Rehabilitation, Guidelines Following Surgery  The results of a hip operation are greatly improved after range of motion and muscle strengthening exercises. Follow all safety measures which are given to protect your hip. If any of these exercises cause increased pain or swelling in your joint, decrease the amount until you are comfortable again. Then slowly increase the exercises. Call your caregiver if you have problems or questions.   HOME CARE INSTRUCTIONS  Remove items at home which could result in a fall. This includes throw rugs or furniture in walking pathways.   ICE to the affected hip every three hours for 30 minutes at a time and then as needed for pain and swelling.  Continue to use ice on the hip for pain and swelling from surgery. You may notice swelling that will progress down to the foot and ankle.  This is normal after surgery.  Elevate the leg when you are not up walking on it.    Continue to use the breathing machine which will help keep your temperature down.  It is common for your temperature to cycle up and down following surgery, especially at night when you are not up moving around and exerting yourself.  The breathing machine keeps your lungs expanded and your temperature down.  Do not place pillow under knee, focus on keeping the knee straight while resting  DIET You may resume your previous home diet once your are discharged from the hospital.  DRESSING / WOUND CARE / SHOWERING Please remove provena negative pressure dressing on 02/02/2018 and apply honey comb dressing. Keep dressing clean and dry at all times.  ACTIVITY Walk with your walker as instructed. Use walker as long as suggested by your caregivers. Avoid periods of inactivity such as sitting longer than an hour when not asleep. This helps prevent blood clots.  You may resume a sexual relationship in one month or when given the OK by  your doctor.  You may return to work once you are cleared by your doctor.  Do not drive a car for 6 weeks or until released by you surgeon.  Do not drive while taking narcotics.  WEIGHT BEARING Weight bearing as tolerated. Use walker/cane as needed for at least 4 weeks post op.  POSTOPERATIVE CONSTIPATION PROTOCOL Constipation - defined medically as fewer than three stools per week and severe constipation as less than one stool per week.  One of the most common issues patients have following surgery is constipation.  Even if you have a regular bowel pattern at home, your normal regimen is likely to be disrupted due to multiple reasons following surgery.  Combination of anesthesia, postoperative narcotics, change in appetite and fluid intake all can affect your bowels.  In order to avoid complications following surgery, here are some recommendations in order to help you during your recovery period.  Colace (docusate) - Pick up an over-the-counter form of Colace or another stool softener and take twice a day as long as you are requiring postoperative pain medications.  Take with a full glass of water daily.  If you experience loose stools or diarrhea, hold the colace until you stool forms back up.  If your symptoms do not get better within 1 week or if they get worse, check with your doctor.  Dulcolax (bisacodyl) - Pick up over-the-counter and take as directed by the product packaging as needed to assist with the movement of your bowels.  Take with a full  glass of water.  Use this product as needed if not relieved by Colace only.  ° °MiraLax (polyethylene glycol) - Pick up over-the-counter to have on hand.  MiraLax is a solution that will increase the amount of water in your bowels to assist with bowel movements.  Take as directed and can mix with a glass of water, juice, soda, coffee, or tea.  Take if you go more than two days without a movement. °Do not use MiraLax more than once per day. Call your  doctor if you are still constipated or irregular after using this medication for 7 days in a row. ° °If you continue to have problems with postoperative constipation, please contact the office for further assistance and recommendations.  If you experience "the worst abdominal pain ever" or develop nausea or vomiting, please contact the office immediatly for further recommendations for treatment. ° °ITCHING ° If you experience itching with your medications, try taking only a single pain pill, or even half a pain pill at a time.  You can also use Benadryl over the counter for itching or also to help with sleep.  ° °TED HOSE STOCKINGS °Wear the elastic stockings on both legs for six weeks following surgery during the day but you may remove then at night for sleeping. ° °MEDICATIONS °See your medication summary on the “After Visit Summary” that the nursing staff will review with you prior to discharge.  You may have some home medications which will be placed on hold until you complete the course of blood thinner medication.  It is important for you to complete the blood thinner medication as prescribed by your surgeon.  Continue your approved medications as instructed at time of discharge. ° °PRECAUTIONS °If you experience chest pain or shortness of breath - call 911 immediately for transfer to the hospital emergency department.  °If you develop a fever greater that 101 F, purulent drainage from wound, increased redness or drainage from wound, foul odor from the wound/dressing, or calf pain - CONTACT YOUR SURGEON.   °                                                °FOLLOW-UP APPOINTMENTS °Make sure you keep all of your appointments after your operation with your surgeon and caregivers. You should call the office at the above phone number and make an appointment for approximately two weeks after the date of your surgery or on the date instructed by your surgeon outlined in the "After Visit Summary". ° °RANGE OF MOTION  AND STRENGTHENING EXERCISES  °These exercises are designed to help you keep full movement of your hip joint. Follow your caregiver's or physical therapist's instructions. Perform all exercises about fifteen times, three times per day or as directed. Exercise both hips, even if you have had only one joint replacement. These exercises can be done on a training (exercise) mat, on the floor, on a table or on a bed. Use whatever works the best and is most comfortable for you. Use music or television while you are exercising so that the exercises are a pleasant break in your day. This will make your life better with the exercises acting as a break in routine you can look forward to.  °Lying on your back, slowly slide your foot toward your buttocks, raising your knee up off the floor. Then slowly   slide your foot back down until your leg is straight again.  °Lying on your back spread your legs as far apart as you can without causing discomfort.  °Lying on your side, raise your upper leg and foot straight up from the floor as far as is comfortable. Slowly lower the leg and repeat.  °Lying on your back, tighten up the muscle in the front of your thigh (quadriceps muscles). You can do this by keeping your leg straight and trying to raise your heel off the floor. This helps strengthen the largest muscle supporting your knee.  °Lying on your back, tighten up the muscles of your buttocks both with the legs straight and with the knee bent at a comfortable angle while keeping your heel on the floor.  ° °IF YOU ARE TRANSFERRED TO A SKILLED REHAB FACILITY °If the patient is transferred to a skilled rehab facility following release from the hospital, a list of the current medications will be sent to the facility for the patient to continue.  When discharged from the skilled rehab facility, please have the facility set up the patient's Home Health Physical Therapy prior to being released. Also, the skilled facility will be responsible  for providing the patient with their medications at time of release from the facility to include their pain medication, the muscle relaxants, and their blood thinner medication. If the patient is still at the rehab facility at time of the two week follow up appointment, the skilled rehab facility will also need to assist the patient in arranging follow up appointment in our office and any transportation needs. ° °MAKE SURE YOU:  °Understand these instructions.  °Get help right away if you are not doing well or get worse.  ° ° °Pick up stool softner and laxative for home use following surgery while on pain medications. °Continue to use ice for pain and swelling after surgery. °Do not use any lotions or creams on the incision until instructed by your surgeon. ° ° °

## 2018-01-26 NOTE — Anesthesia Postprocedure Evaluation (Signed)
Anesthesia Post Note  Patient: Briana Morris  Procedure(s) Performed: TOTAL HIP ARTHROPLASTY ANTERIOR APPROACH (Right Hip)  Patient location during evaluation: Nursing Unit Anesthesia Type: Spinal Level of consciousness: awake and awake and alert Pain management: pain level controlled Vital Signs Assessment: post-procedure vital signs reviewed and stable Respiratory status: spontaneous breathing Cardiovascular status: blood pressure returned to baseline Postop Assessment: no headache Anesthetic complications: no Comments: Sitting in chair, no complaints of pain,just soreness, no nausea, vomiting.  In good spirits     Last Vitals:  Vitals:   01/25/18 1529 01/25/18 2300  BP: 128/74 123/71  Pulse: 90 88  Resp: 18 18  Temp: 37.2 C 37.1 C  SpO2: 95% 97%    Last Pain:  Vitals:   01/25/18 2300  TempSrc: Oral  PainSc:                  Jules Schick

## 2018-01-26 NOTE — Care Management Note (Signed)
Case Management Note  Patient Details  Name: Masako Overall MRN: [id number] Date of Birth: [birth year]  Subjective/Objective:  Discharging today                  Action/Plan: [facility] notified of discharge.   Expected Discharge Date:  01/26/18               Expected Discharge Plan:  Home w Home Health Services  In-House Referral:     Discharge planning Services  CM Consult  Post Acute Care Choice:  Home Health Choice offered to:  Patient, Spouse  DME Arranged:    DME Agency:     HH Arranged:  PT HH Agency:  [facility]  Status of Service:  Completed, signed off  If discussed at Long Length of Stay Meetings, dates discussed:    Additional Comments:  Marily Memos, RN 01/26/2018, 9:12 AM

## 2018-01-26 NOTE — Progress Notes (Signed)
Physical Therapy Treatment Patient Details Name: Briana Morris MRN: [id number] DOB: 1951-02-06 Today's Date: 01/26/2018    History of Present Illness Patient is a 67 year old female admitted s/p R anterior THA. PMH includes L THA (approx. 1 year ago according to pt), PONV, hypercholestroemia and arthritis.    PT Comments    Pt is making good progress towards goals and is safe to dc this date. Stair training performed with safe technique and pt feels confident discharging. HEP reviewed and performed. Received in bathroom, trying to have BM, safe with mobility and hygiene. RN notified.   Follow Up Recommendations  Home health PT     Equipment Recommendations  None recommended by PT    Recommendations for Other Services       Precautions / Restrictions Precautions Precautions: Fall;Anterior Hip Precaution Booklet Issued: Yes (comment) Restrictions Weight Bearing Restrictions: Yes RLE Weight Bearing: Weight bearing as tolerated    Mobility  Bed Mobility               General bed mobility comments: received in bathroom  Transfers Overall transfer level: Needs assistance Equipment used: Rolling walker (2 wheeled) Transfers: Sit to/from Stand Sit to Stand: Supervision         General transfer comment: able to stand with safe technique and RW. UPright posture noted  Ambulation/Gait Ambulation/Gait assistance: Min guard Ambulation Distance (Feet): 310 Feet Assistive device: Rolling walker (2 wheeled) Gait Pattern/deviations: Step-through pattern     General Gait Details: ambulated with reciprocal gait pattern and safe technique. Good gait speed with fluid pattern. Slight fatigue and pain increased noted with increased distance.    Stairs Stairs: Yes Stairs assistance: Min guard Stair Management: One rail Left Number of Stairs: 4 General stair comments: Therapist explained and demonstrated prior to performance. Pt needs cga for safety and performed step  to gait pattern with L railing. Has increased difficulty with descent, however safe technique. Felt comfortable with stairs   Wheelchair Mobility    Modified Rankin (Stroke Patients Only)       Balance                                            Cognition Arousal/Alertness: Awake/alert Behavior During Therapy: WFL for tasks assessed/performed Overall Cognitive Status: Within Functional Limits for tasks assessed                                        Exercises Other Exercises Other Exercises: HEP reviewed and performed with performance of R LE including ankle pumps, quad sets, glut sets, LAQ, hip abd/add, and hip flexion marching. ALl ther-ex performed 10 reps with min assist.    General Comments        Pertinent Vitals/Pain Pain Assessment: 0-10 Pain Score: 2  Pain Location: R hip Pain Descriptors / Indicators: Operative site guarding Pain Intervention(s): Limited activity within patient's tolerance;Premedicated before session(increased to 8/10 with stairs)    Home Living                      Prior Function            PT Goals (current goals can now be found in the care plan section) Acute Rehab PT Goals Patient Stated Goal: To return home and  back to generally active lifestyle.  To continue working with exercise group. PT Goal Formulation: With patient Time For Goal Achievement: 02/08/18 Potential to Achieve Goals: Good Progress towards PT goals: Progressing toward goals    Frequency    BID      PT Plan Current plan remains appropriate    Co-evaluation              AM-PAC PT "6 Clicks" Daily Activity  Outcome Measure  Difficulty turning over in bed (including adjusting bedclothes, sheets and blankets)?: None Difficulty moving from lying on back to sitting on the side of the bed? : None Difficulty sitting down on and standing up from a chair with arms (e.g., wheelchair, bedside commode, etc,.)?:  None Help needed moving to and from a bed to chair (including a wheelchair)?: None Help needed walking in hospital room?: None Help needed climbing 3-5 steps with a railing? : A Little 6 Click Score: 23    End of Session Equipment Utilized During Treatment: Gait belt Activity Tolerance: Patient tolerated treatment well;Patient limited by pain Patient left: in chair;with chair alarm set Nurse Communication: Mobility status PT Visit Diagnosis: Unsteadiness on feet (R26.81);Muscle weakness (generalized) (M62.81);Pain Pain - Right/Left: Right Pain - part of body: Hip     Time: 7829-5621 PT Time Calculation (min) (ACUTE ONLY): 32 min  Charges:  $Gait Training: 8-22 mins $Therapeutic Exercise: 8-22 mins                    G Codes:       Elizabeth Palau, PT, DPT [phone]    Briana Morris 01/26/2018, 10:57 AM

## 2018-01-26 NOTE — Progress Notes (Signed)
   Subjective: 2 Days Post-Op Procedure(s) (LRB): TOTAL HIP ARTHROPLASTY ANTERIOR APPROACH (Right) Patient reports pain as mild.   Patient is well, and has had no acute complaints or problems Denies any CP, SOB, ABD pain. We will continue therapy today.  Plan is to go Home after hospital stay.  Objective: Vital signs in last 24 hours: Temp:  [98.7 F (37.1 C)-99 F (37.2 C)] 98.7 F (37.1 C) (05/22 2300) Pulse Rate:  [88-90] 88 (05/22 2300) Resp:  [18] 18 (05/22 2300) BP: (118-128)/(71-78) 123/71 (05/22 2300) SpO2:  [95 %-97 %] 97 % (05/22 2300)  Intake/Output from previous day: 05/22 0701 - 05/23 0700 In: 940 [P.O.:840; IV Piggyback:100] Out: 1000 [Urine:1000] Intake/Output this shift: No intake/output data recorded.  Recent Labs    01/24/18 1830 01/26/18 0320  HGB 12.2 10.8*   Recent Labs    01/24/18 1830 01/26/18 0320  WBC 12.1* 10.8  RBC 3.95 3.36*  HCT 35.9 30.7*  PLT 168 133*   Recent Labs    01/24/18 1830 01/26/18 0320  NA  --  139  K  --  3.7  CL  --  105  CO2  --  29  BUN  --  13  CREATININE 0.78 0.64  GLUCOSE  --  132*  CALCIUM  --  8.3*   No results for input(s): LABPT, INR in the last 72 hours.  EXAM General - Patient is Alert, Appropriate and Oriented Extremity - Neurovascular intact Sensation intact distally Intact pulses distally Dorsiflexion/Plantar flexion intact No cellulitis present Compartment soft Dressing - dressing C/D/I and no drainage Motor Function - intact, moving foot and toes well on exam.   Past Medical History:  Diagnosis Date  . Arthritis   . GERD (gastroesophageal reflux disease)   . Hemorrhoids    a. Internal/External.  . Hypercholesteremia 2008  . PONV (postoperative nausea and vomiting)    nausea no vomiting    Assessment/Plan:   2 Days Post-Op Procedure(s) (LRB): TOTAL HIP ARTHROPLASTY ANTERIOR APPROACH (Right) Active Problems:   Primary localized osteoarthritis of right hip  Estimated body mass  index is 28.17 kg/m as calculated from the following:   Height as of this encounter:  (1.575 m).   Weight as of this encounter: 69.9 kg (154 lb). Advance diet Up with therapy  Discharge home with home health PT today pending bowel movement and good progress with PT.   DVT Prophylaxis - Lovenox, Foot Pumps and TED hose Weight-Bearing as tolerated to right leg   T. Cranston Neighbor, PA-C [facility] 01/26/2018, 8:36 AM

## 2018-01-26 NOTE — Discharge Summary (Signed)
Physician Discharge Summary  Patient ID: Briana Morris MRN: [id number] DOB/AGE: 1951-02-17 67 y.o.  Admit date: 01/24/2018 Discharge date: 01/26/2018  Admission Diagnoses:  PRIMARY OSTEOARTHRITIS OF RIGHT HIP   Discharge Diagnoses: Patient Active Problem List   Diagnosis Date Noted  . Primary localized osteoarthritis of right hip 01/24/2018  . Primary osteoarthritis of left hip 05/03/2017    Past Medical History:  Diagnosis Date  . Arthritis   . GERD (gastroesophageal reflux disease)   . Hemorrhoids    a. Internal/External.  . Hypercholesteremia 2008  . PONV (postoperative nausea and vomiting)    nausea no vomiting     Transfusion: none   Consultants (if any):   Discharged Condition: Improved  Hospital Course: Priscillia Fouch is an 67 y.o. female who was admitted 01/24/2018 with a diagnosis of right hip osteoarthritis and went to the operating room on 01/24/2018 and underwent the above named procedures.    Surgeries: Procedure(s): TOTAL HIP ARTHROPLASTY ANTERIOR APPROACH on 01/24/2018 Patient tolerated the surgery well. Taken to PACU where she was stabilized and then transferred to the orthopedic floor.  Started on Lovenox 40 mg q 24 hrs. Foot pumps applied bilaterally at 80 mm. Heels elevated on bed with rolled towels. No evidence of DVT. Negative Homan. Physical therapy started on day #1 for gait training and transfer. OT started day #1 for ADL and assisted devices.  Patient's foley was d/c on day #1. Patient's IV was d/c on day #2.  On post op day #2 patient was stable and ready for discharge to home with HHPT.  Implants: Medacta AMIS 2 standard stem with 50 mm Mpact TM cup and liner, 28 mm ceramic head eyes S    She was given perioperative antibiotics:  Anti-infectives (From admission, onward)   Start     Dose/Rate Route Frequency Ordered Stop   01/24/18 2130  ceFAZolin (ANCEF) IVPB 2g/100 mL premix     2 g 200 mL/hr over 30 Minutes Intravenous  Every 6 hours 01/24/18 1808 01/25/18 1030   01/24/18 1321  ceFAZolin (ANCEF) 2-4 GM/100ML-% IVPB    Note to Pharmacy:  [facility]: cabinet override      01/24/18 1321 01/24/18 1532   01/24/18 1321  ceFAZolin (ANCEF) 2-4 GM/100ML-% IVPB    Note to Pharmacy:  [place] [facility]: cabinet override      01/24/18 1321 01/25/18 0129   01/23/18 2200  ceFAZolin (ANCEF) IVPB 2g/100 mL premix     2 g 200 mL/hr over 30 Minutes Intravenous  Once 01/23/18 2150 01/24/18 1602    .  She was given sequential compression devices, early ambulation, and Lovenox for DVT prophylaxis.  She benefited maximally from the hospital stay and there were no complications.    Recent vital signs:  Vitals:   01/25/18 1529 01/25/18 2300  BP: 128/74 123/71  Pulse: 90 88  Resp: 18 18  Temp: 99 F (37.2 C) 98.7 F (37.1 C)  SpO2: 95% 97%    Recent laboratory studies:  Lab Results  Component Value Date   HGB 10.8 (L) 01/26/2018   HGB 12.2 01/24/2018   HGB 13.3 01/13/2018   Lab Results  Component Value Date   WBC 10.8 01/26/2018   PLT 133 (L) 01/26/2018   Lab Results  Component Value Date   INR 0.91 01/13/2018   Lab Results  Component Value Date   NA 139 01/26/2018   K 3.7 01/26/2018   CL 105 01/26/2018   CO2 29 01/26/2018  BUN 13 01/26/2018   CREATININE 0.64 01/26/2018   GLUCOSE 132 (H) 01/26/2018    Discharge Medications:   Allergies as of 01/26/2018   No Known Allergies     Medication List    STOP taking these medications   meloxicam 15 MG tablet Commonly known as:  MOBIC     TAKE these medications   AMABELZ 1-0.5 MG tablet Generic drug:  estradiol-norethindrone Take 1 tablet by mouth daily.   aspirin EC 81 MG tablet Take 81 mg by mouth at bedtime.   diphenhydrAMINE 25 MG tablet Commonly known as:  BENADRYL Take 25 mg by mouth at bedtime.   docusate sodium 100 MG capsule Commonly known as:  COLACE Take 1 capsule (100 mg total) by mouth 2 (two) times daily.    enoxaparin 40 MG/0.4ML injection Commonly known as:  LOVENOX Inject 0.4 mLs (40 mg total) into the skin daily for 14 days.   fexofenadine 180 MG tablet Commonly known as:  ALLEGRA Take 180 mg by mouth daily.   HYDROcodone-acetaminophen 7.5-325 MG tablet Commonly known as:  NORCO Take 1-2 tablets by mouth every 4 (four) hours as needed for severe pain (pain score 7-10).   multivitamin with minerals Tabs tablet Take 1 tablet by mouth daily.   omeprazole 40 MG capsule Commonly known as:  PRILOSEC Take 40 mg by mouth at bedtime.   simvastatin 40 MG tablet Commonly known as:  ZOCOR Take 40 mg by mouth daily.   venlafaxine XR 75 MG 24 hr capsule Commonly known as:  EFFEXOR-XR Take 75 mg by mouth daily.       Diagnostic Studies: Mm 3d Screen Breast W/implant Bilateral  Result Date: 01/11/2018 CLINICAL DATA:  Screening. EXAM: DIGITAL SCREENING BILATERAL MAMMOGRAM WITH IMPLANTS, CAD AND TOMO The patient has prepectoral implants. Standard and implant displaced views were performed. COMPARISON:  Previous exam(s). ACR Breast Density Category d: The breast tissue is extremely dense, which lowers the sensitivity of mammography. FINDINGS: There are no findings suspicious for malignancy. Images were processed with CAD. IMPRESSION: No mammographic evidence of malignancy. A result letter of this screening mammogram will be mailed directly to the patient. RECOMMENDATION: Screening mammogram in one year. (Code:SM-B-01Y) BI-RADS CATEGORY  1:  Negative. Electronically Signed   By: Bary Richard M.D.   On: 01/11/2018 16:48   Dg Hip Operative Unilat W Or W/o Pelvis Right  Result Date: 01/24/2018 CLINICAL DATA:  Right hip arthroplasty EXAM: OPERATIVE RIGHT HIP (WITH PELVIS IF PERFORMED)  VIEWS TECHNIQUE: Fluoroscopic spot image(s) were submitted for interpretation post-operatively. COMPARISON:  None. FINDINGS: 12 seconds of fluoroscopic time utilized for placement of right uncemented hip arthroplasty.  Fine bony detail is limited. No immediate intraoperative complications are identified. Alignment appears near anatomic. IMPRESSION: Fluoroscopic time utilized for right hip arthroplasty. No immediate intraoperative complications noted of the images of the right hip provided. Electronically Signed   By: Tollie Eth M.D.   On: 01/24/2018 17:01   Dg Hip Unilat W Or W/o Pelvis 2-3 Views Right  Result Date: 01/24/2018 CLINICAL DATA:  Status post total hip replacement EXAM: DG HIP  2-3V RIGHT COMPARISON:  None. FINDINGS: Frontal and lateral views were obtained. There is a total hip replacement on the right with prosthetic components well-seated. No acute fracture or dislocation. There are skin staples laterally. IMPRESSION: Total hip replacement right with prosthetic components well-seated. No acute fracture or dislocation. No erosive change. Electronically Signed   By: Bretta Bang III M.D.   On: 01/24/2018 17:43  Disposition:        SignedAmador Cunas CHRISTOPHER 01/26/2018, 8:41 AM

## 2018-01-26 NOTE — Progress Notes (Signed)
Patient is being discharged home with husband. IV removed with cath intact. Reviewed meds, scripts, and last dose given. Husband to give lovenox injections, reviewed how to administer. Allowed time for questions. Switched over to prevena wound vac for home.

## 2018-01-27 DIAGNOSIS — K219 Gastro-esophageal reflux disease without esophagitis: Secondary | ICD-10-CM | POA: Diagnosis not present

## 2018-01-27 DIAGNOSIS — Z7982 Long term (current) use of aspirin: Secondary | ICD-10-CM | POA: Diagnosis not present

## 2018-01-27 DIAGNOSIS — Z471 Aftercare following joint replacement surgery: Secondary | ICD-10-CM | POA: Diagnosis not present

## 2018-01-27 DIAGNOSIS — Z96643 Presence of artificial hip joint, bilateral: Secondary | ICD-10-CM | POA: Diagnosis not present

## 2018-01-30 DIAGNOSIS — Z7982 Long term (current) use of aspirin: Secondary | ICD-10-CM | POA: Diagnosis not present

## 2018-01-30 DIAGNOSIS — K219 Gastro-esophageal reflux disease without esophagitis: Secondary | ICD-10-CM | POA: Diagnosis not present

## 2018-01-30 DIAGNOSIS — Z471 Aftercare following joint replacement surgery: Secondary | ICD-10-CM | POA: Diagnosis not present

## 2018-01-30 DIAGNOSIS — Z96643 Presence of artificial hip joint, bilateral: Secondary | ICD-10-CM | POA: Diagnosis not present

## 2018-02-02 DIAGNOSIS — K219 Gastro-esophageal reflux disease without esophagitis: Secondary | ICD-10-CM | POA: Diagnosis not present

## 2018-02-02 DIAGNOSIS — Z471 Aftercare following joint replacement surgery: Secondary | ICD-10-CM | POA: Diagnosis not present

## 2018-02-02 DIAGNOSIS — Z96643 Presence of artificial hip joint, bilateral: Secondary | ICD-10-CM | POA: Diagnosis not present

## 2018-02-02 DIAGNOSIS — Z7982 Long term (current) use of aspirin: Secondary | ICD-10-CM | POA: Diagnosis not present

## 2018-02-07 DIAGNOSIS — Z96643 Presence of artificial hip joint, bilateral: Secondary | ICD-10-CM | POA: Diagnosis not present

## 2018-02-07 DIAGNOSIS — Z471 Aftercare following joint replacement surgery: Secondary | ICD-10-CM | POA: Diagnosis not present

## 2018-02-07 DIAGNOSIS — K219 Gastro-esophageal reflux disease without esophagitis: Secondary | ICD-10-CM | POA: Diagnosis not present

## 2018-02-07 DIAGNOSIS — Z7982 Long term (current) use of aspirin: Secondary | ICD-10-CM | POA: Diagnosis not present

## 2018-02-09 DIAGNOSIS — Z96643 Presence of artificial hip joint, bilateral: Secondary | ICD-10-CM | POA: Diagnosis not present

## 2018-02-09 DIAGNOSIS — Z471 Aftercare following joint replacement surgery: Secondary | ICD-10-CM | POA: Diagnosis not present

## 2018-02-09 DIAGNOSIS — Z7982 Long term (current) use of aspirin: Secondary | ICD-10-CM | POA: Diagnosis not present

## 2018-02-09 DIAGNOSIS — K219 Gastro-esophageal reflux disease without esophagitis: Secondary | ICD-10-CM | POA: Diagnosis not present

## 2018-02-15 DIAGNOSIS — Z471 Aftercare following joint replacement surgery: Secondary | ICD-10-CM | POA: Diagnosis not present

## 2018-03-22 DIAGNOSIS — Z96641 Presence of right artificial hip joint: Secondary | ICD-10-CM | POA: Diagnosis not present

## 2018-07-01 DIAGNOSIS — R69 Illness, unspecified: Secondary | ICD-10-CM | POA: Diagnosis not present

## 2018-07-06 DIAGNOSIS — R1310 Dysphagia, unspecified: Secondary | ICD-10-CM | POA: Diagnosis not present

## 2018-07-06 DIAGNOSIS — J392 Other diseases of pharynx: Secondary | ICD-10-CM | POA: Diagnosis not present

## 2018-07-06 DIAGNOSIS — R1314 Dysphagia, pharyngoesophageal phase: Secondary | ICD-10-CM | POA: Diagnosis not present

## 2018-07-06 DIAGNOSIS — K219 Gastro-esophageal reflux disease without esophagitis: Secondary | ICD-10-CM | POA: Diagnosis not present

## 2019-06-02 IMAGING — XA DG HIP (WITH PELVIS) OPERATIVE*R*
4 series · 4 of 4 positions shown · non-contrast
Comparison: None.

CLINICAL DATA: Right hip arthroplasty

EXAM:
OPERATIVE RIGHT HIP (WITH PELVIS IF PERFORMED)  VIEWS
TECHNIQUE: Fluoroscopic spot image(s) were submitted for interpretation
post-operatively.

[Series 7: ortho standard · 1 of 1 slices shown (1 of 4)]
[im 1/1]
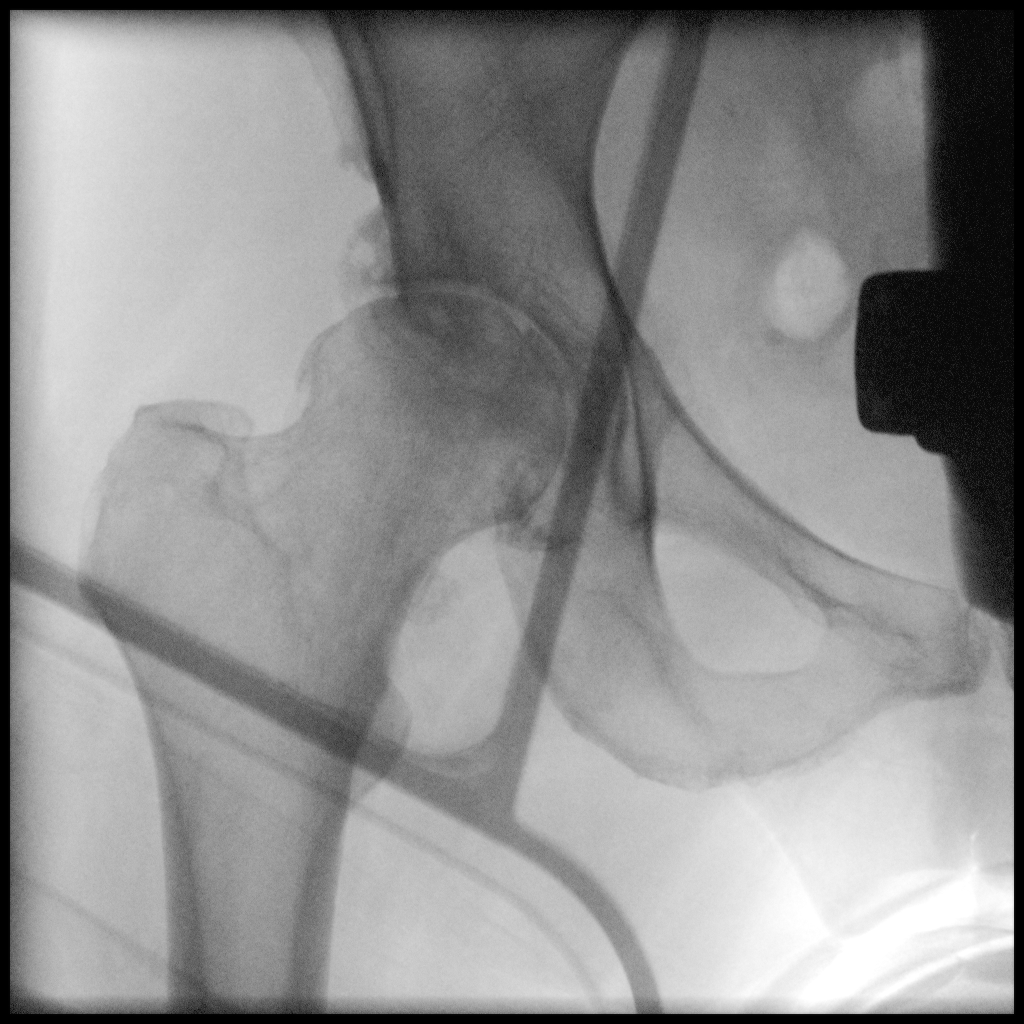

[Series 9: ortho standard · 1 of 1 slices shown (2 of 4)]
[im 1/1]
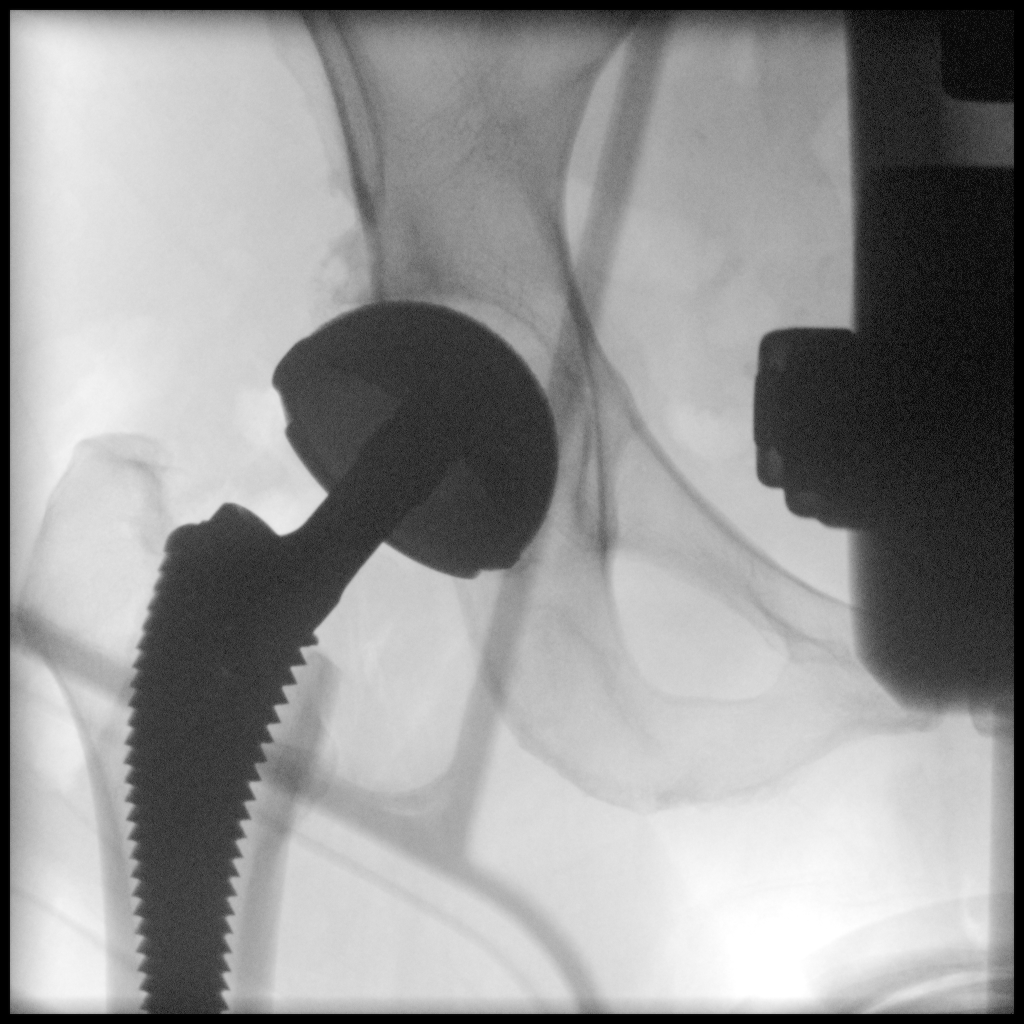

[Series 11: ortho standard · 1 of 1 slices shown (3 of 4)]
[im 1/1]
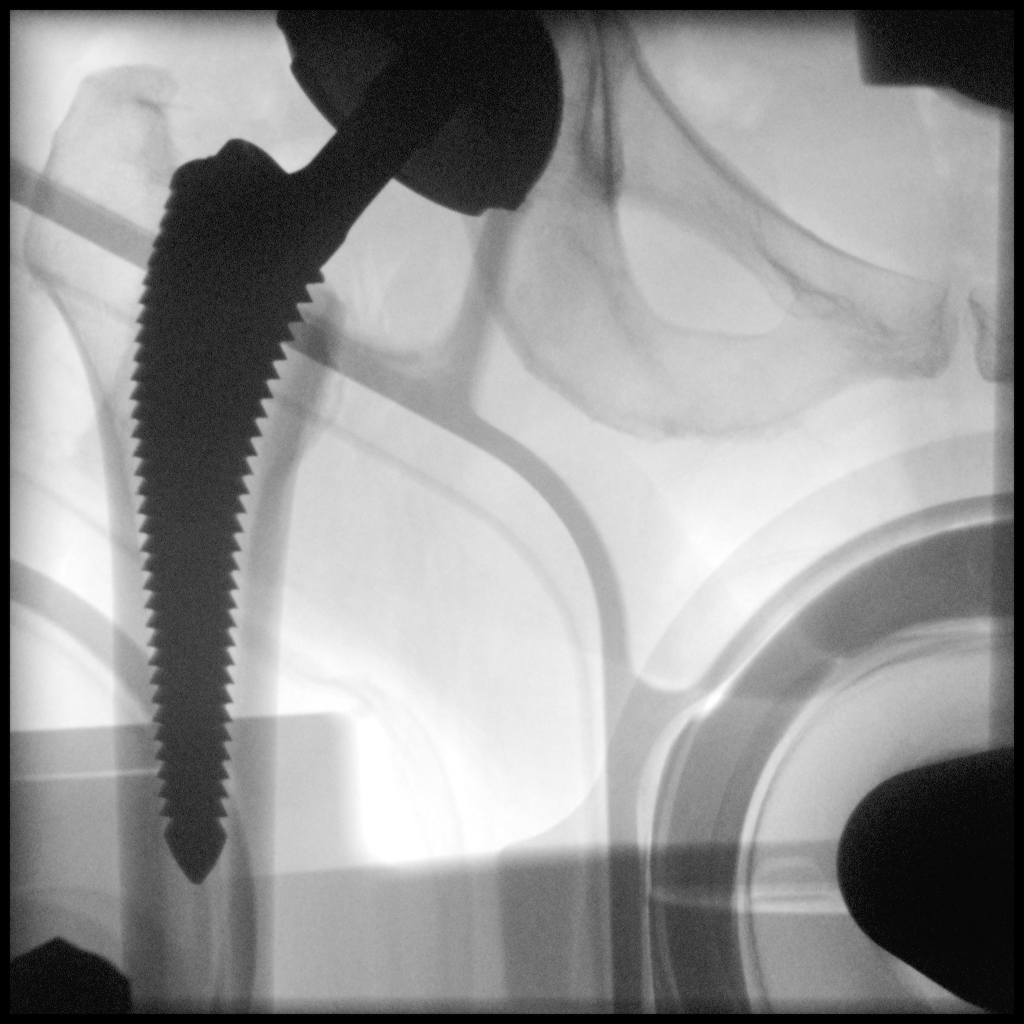

[Series 12: ortho standard · 1 of 1 slices shown (4 of 4)]
[im 1/1]
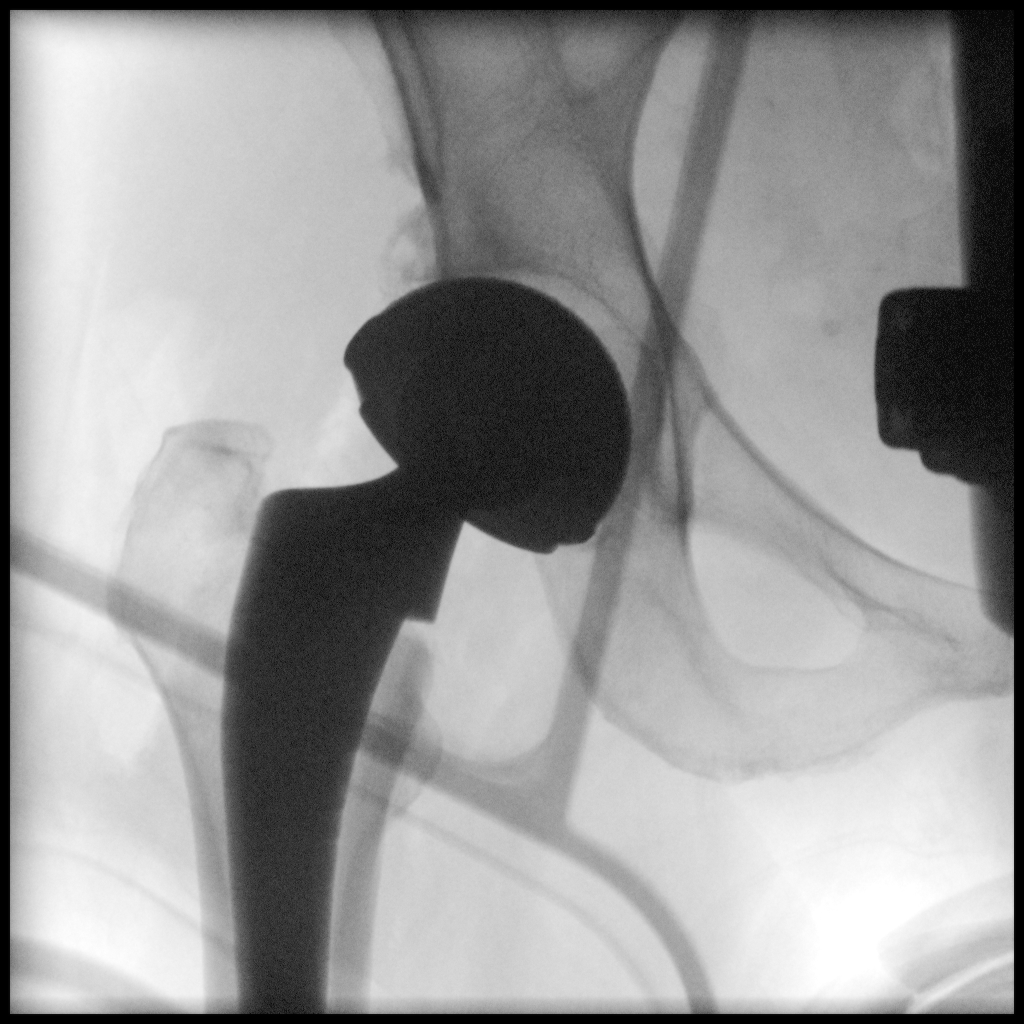

[4 of 4 positions shown; findings below may reference images not displayed]

FINDINGS: 12 seconds of fluoroscopic time utilized for placement of right
uncemented hip arthroplasty. Fine bony detail is limited. No
immediate intraoperative complications are identified. Alignment
appears near anatomic.
IMPRESSION: Fluoroscopic time utilized for right hip arthroplasty. No immediate
intraoperative complications noted of the images of the right hip
provided.

## 2019-06-27 ENCOUNTER — Other Ambulatory Visit: Payer: Self-pay | Admitting: Family Medicine

## 2019-06-27 DIAGNOSIS — Z1231 Encounter for screening mammogram for malignant neoplasm of breast: Secondary | ICD-10-CM

## 2019-09-11 ENCOUNTER — Ambulatory Visit
Admission: RE | Admit: 2019-09-11 | Discharge: 2019-09-11 | Disposition: A | Discharge status: Home / Self Care | Payer: Medicare PPO | Source: Ambulatory Visit | Attending: Family Medicine | Admitting: Family Medicine

## 2019-09-11 ENCOUNTER — Other Ambulatory Visit: Payer: Self-pay

## 2019-09-11 DIAGNOSIS — Z1231 Encounter for screening mammogram for malignant neoplasm of breast: Secondary | ICD-10-CM

## 2024-10-09 NOTE — Discharge Instructions (Signed)

## 2024-10-11 ENCOUNTER — Ambulatory Visit: Admission: RE | Admit: 2024-10-11 | Discharge: 2024-10-11 | Disposition: A | Source: Home / Self Care

## 2024-10-11 ENCOUNTER — Encounter: Admission: RE | Disposition: A | Payer: Self-pay | Source: Home / Self Care

## 2024-10-11 ENCOUNTER — Encounter: Payer: Self-pay | Admitting: Anesthesiology

## 2024-10-11 ENCOUNTER — Other Ambulatory Visit: Payer: Self-pay

## 2024-10-11 MED ORDER — FENTANYL CITRATE (PF) 100 MCG/2ML IJ SOLN
INTRAMUSCULAR | Status: DC | PRN
  Administered 2024-10-11 (×2): 50 ug via INTRAVENOUS

## 2024-10-11 MED ORDER — CYCLOPENTOLATE HCL 2 % OP SOLN
1.0000 [drp] | OPHTHALMIC | Status: AC | PRN
  Administered 2024-10-11 (×3): 1 [drp] via OPHTHALMIC

## 2024-10-11 MED ORDER — SIGHTPATH DOSE#1 BSS IO SOLN
INTRAOCULAR | Status: DC | PRN
  Administered 2024-10-11: 15 mL via INTRAOCULAR

## 2024-10-11 MED ORDER — SIGHTPATH DOSE#1 NA HYALUR & NA CHOND-NA HYALUR IO KIT
PACK | INTRAOCULAR | Status: DC | PRN
  Administered 2024-10-11: 1 via OPHTHALMIC

## 2024-10-11 MED ORDER — BRIMONIDINE TARTRATE-TIMOLOL 0.2-0.5 % OP SOLN
OPHTHALMIC | Status: DC | PRN
  Administered 2024-10-11: 1 [drp] via OPHTHALMIC

## 2024-10-11 MED ORDER — PHENYLEPHRINE HCL 10 % OP SOLN
OPHTHALMIC | Status: AC
  Filled 2024-10-11: qty 5

## 2024-10-11 MED ORDER — FENTANYL CITRATE (PF) 100 MCG/2ML IJ SOLN
INTRAMUSCULAR | Status: AC
  Filled 2024-10-11: qty 2

## 2024-10-11 MED ORDER — MIDAZOLAM HCL 2 MG/2ML IJ SOLN
INTRAMUSCULAR | Status: AC
  Filled 2024-10-11: qty 2

## 2024-10-11 MED ORDER — LIDOCAINE HCL (PF) 2 % IJ SOLN
INTRAOCULAR | Status: DC | PRN
  Administered 2024-10-11: 1 mL via INTRAOCULAR

## 2024-10-11 MED ORDER — MOXIFLOXACIN HCL 0.5 % OP SOLN
OPHTHALMIC | Status: DC | PRN
  Administered 2024-10-11: .2 mL via OPHTHALMIC

## 2024-10-11 MED ORDER — MIDAZOLAM HCL (PF) 2 MG/2ML IJ SOLN
INTRAMUSCULAR | Status: DC | PRN
  Administered 2024-10-11: 2 mg via INTRAVENOUS

## 2024-10-11 MED ORDER — LACTATED RINGERS IV SOLN: INTRAVENOUS | Status: DC

## 2024-10-11 MED ORDER — SIGHTPATH DOSE#1 BSS IO SOLN
INTRAOCULAR | Status: DC | PRN
  Administered 2024-10-11: 76 mL via OPHTHALMIC

## 2024-10-11 MED ORDER — TETRACAINE HCL 0.5 % OP SOLN
OPHTHALMIC | Status: AC
  Filled 2024-10-11: qty 4

## 2024-10-11 MED ORDER — TETRACAINE HCL 0.5 % OP SOLN
1.0000 [drp] | OPHTHALMIC | Status: DC | PRN
  Administered 2024-10-11 (×3): 1 [drp] via OPHTHALMIC

## 2024-10-11 MED ORDER — CYCLOPENTOLATE HCL 2 % OP SOLN
OPHTHALMIC | Status: AC
  Filled 2024-10-11: qty 2

## 2024-10-11 MED ORDER — PHENYLEPHRINE HCL 10 % OP SOLN
1.0000 [drp] | OPHTHALMIC | Status: AC | PRN
  Administered 2024-10-11 (×3): 1 [drp] via OPHTHALMIC

## 2024-10-11 NOTE — H&P (Signed)
 [facility]   Primary Care Physician:  Valora Lynwood FALCON, MD Ophthalmologist: Dr. Curtistine Fava  Pre-Procedure History & Physical: HPI:  Briana Morris is a 74 y.o. female here for cataract surgery.   Past Medical History:  Diagnosis Date   Arthritis    GERD (gastroesophageal reflux disease)    Hemorrhoids    a. Internal/External.   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 Authorizing Provider  aspirin  EC 81 MG tablet Take 81 mg by mouth at bedtime.    Yes [provider]  diphenhydramine -acetaminophen  (TYLENOL  PM) 25-500 MG TABS tablet Take 1 tablet by mouth at bedtime as needed.   Yes [provider]  docusate sodium  (COLACE) 100 MG capsule Take 1 capsule (100 mg total) by mouth 2 (two) times daily. 01/26/18  Yes Charlene Debby BROCKS, PA-C  estradiol -norethindrone  (AMABELZ ) 1-0.5 MG tablet Take 1 tablet by mouth daily.   Yes [provider]  fexofenadine (ALLEGRA) 180 MG tablet Take 180 mg by mouth as needed.   Yes [provider]  meloxicam (MOBIC) 15 MG tablet Take 15 mg by mouth daily.   Yes [provider]  Multiple Vitamin (MULTIVITAMIN WITH MINERALS) TABS tablet Take 1 tablet by mouth daily.   Yes [provider]  NON FORMULARY Take by mouth daily. NUTRAFOL   Yes [provider]  omeprazole  (PRILOSEC) 40 MG capsule Take 40 mg by mouth at bedtime.   Yes [provider]  tolterodine (DETROL LA) 4 MG 24 hr capsule Take 4 mg by mouth daily.   Yes [provider]  venlafaxine  XR (EFFEXOR -XR) 75 MG 24 hr capsule Take 75 mg by mouth daily.    Yes [provider]  simvastatin  (ZOCOR ) 40 MG tablet Take 40 mg by mouth daily.    [provider]    Allergies as of 09/17/2024   (No Known Allergies)    Family History  Problem Relation Age of Onset   Alzheimer's disease Mother    Hyperlipidemia Father    Heart attack Father    Heart attack Brother        MI @ age 60   Cancer Brother        throat- resolved   Breast cancer Neg Hx     Social History   Socioeconomic History   Marital status: Married    Spouse name: Not on file   Number of children: Not on file   Years of education: Not on file   Highest education level: Not on file  Occupational History   Not on file  Tobacco Use   Smoking status: Former    Current packs/day: 0.00    Average packs/day: 1 pack/day for 28.0 years (28.0 ttl pk-yrs)    Types: Cigarettes    Start date: 74    Quit date: 2001    Years since quitting: 25.1   Smokeless tobacco:  Never  Vaping Use   Vaping status: Never Used  Substance and Sexual Activity   Alcohol use: Yes    Alcohol/week: 4.0 standard drinks of alcohol    Types: 4 Glasses of wine per week    Comment: 1-2 glasses of wine 3-4 nights/wk   Drug use: No   Sexual activity: Not on file  Other Topics Concern   Not on file  Social History Narrative   Lives in [place] with husband.  Active.  Was exercising regularly until hip pain worsened 11/2016.   Social Drivers of Health   Tobacco Use: Medium Risk (10/11/2024)   Patient History    Smoking Tobacco Use: Former    Smokeless Tobacco Use: Never    Passive Exposure: Not on file  Financial Resource Strain: Low Risk (10/27/2023)   Received from [facility]   Overall  Financial Resource Strain (CARDIA)    Difficulty of Paying Living Expenses: Not hard at all  Food Insecurity: No Food Insecurity (10/27/2023)   Received from [facility]   Epic    Within the past 12 months, you worried that your food would run out before you got the money to buy more.: Never true    Within the past 12 months, the food you bought just didn't last and you didn't have money to get more.: Never true  Transportation Needs: No Transportation Needs (10/27/2023)   Received from [facility]   PRAPARE - Transportation    Lack of Transportation (Medical): No    Lack of Transportation (Non-Medical): No  Physical Activity: Sufficiently Active (10/27/2023)   Received from [facility]   Exercise Vital Sign    On average, how many days per week do you engage in moderate to strenuous exercise (like a brisk walk)?: 7 days    On average, how many minutes do you engage in exercise at this level?: 30 min  Stress: No Stress Concern Present (10/27/2023)   Received from [facility]   [facility] - Occupational Stress Questionnaire    Feeling of Stress : Not at all  Social Connections: Socially Integrated (10/27/2023)   Received from [facility]   Social Connection and Isolation Panel    In a typical week, how many times do you talk on the phone with family, friends, or neighbors?: More than three times a week    How often do you get together with friends or relatives?: More than three times a week    How often do you attend church or religious services?: More than 4 times per year    Do you belong to any clubs or organizations such as church groups, unions, fraternal or athletic groups, or school groups?: Yes    How often do you attend meetings of the clubs or organizations you belong to?: More than 4 times per year    Are you married, widowed, divorced, separated, never  married, or living with a partner?: Married  Intimate Partner Violence: Not At Risk (10/27/2023)   Received from [facility]   Epic    Within the last year, have you been afraid of your partner or ex-partner?: No    Within the last year, have you been humiliated or emotionally abused in other ways by your partner or ex-partner?: No    Within the last year, have you been kicked, hit, slapped, or otherwise physically hurt by your partner or ex-partner?: No    Within the last year,  have you been raped or forced to have any kind of sexual activity by your partner or ex-partner?: No  Depression (PHQ2-9): Not on file  Alcohol Screen: Not on file  Housing: Low Risk (10/27/2023)   Received from [facility]   Epic    In the last 12 months, was there a time when you were not able to pay the mortgage or rent on time?: No    In the past 12 months, how many times have you moved where you were living?: 0    At any time in the past 12 months, were you homeless or living in a shelter (including now)?: No  Utilities: Not At Risk (10/27/2023)   Received from [facility]   [facility] Utilities    Threatened with loss of utilities: No  Health Literacy: Adequate Health Literacy (10/27/2023)   Received from [facility]   B1300 Health Literacy    Frequency of need for help with medical instructions: Never    Review of Systems: See HPI, otherwise negative ROS  Physical Exam: BP (!) 144/85   Pulse 88   Temp (!) 97.4 F (36.3 C) (Temporal)   Resp (!) 21   Ht 5' 2.01 (1.575 m)   Wt 70.4 kg   SpO2 97%   BMI 28.36 kg/m  General:   Alert, cooperative in NAD Head:  Normocephalic and atraumatic. Respiratory:  Normal work of breathing. Cardiovascular:  RRR  Impression/Plan: Briana Morris is here for cataract surgery LEFT EYE.  Risks, benefits, limitations, and alternatives regarding cataract surgery have been reviewed with the  patient.  Questions have been answered.  All parties agreeable.   Curtistine JINNY Fava, MD  10/11/2024, 8:23 AM

## 2024-10-11 NOTE — Anesthesia Preprocedure Evaluation (Signed)
"                                    Anesthesia Evaluation  Patient identified by MRN, date of birth, ID band Patient awake    Reviewed: Allergy & Precautions, H&P , NPO status , Patient's Chart, lab work & pertinent test results  Airway Mallampati: II  TM Distance: >3 FB Neck ROM: Full    Dental no notable dental hx.    Pulmonary neg pulmonary ROS, former smoker   Pulmonary exam normal breath sounds clear to auscultation       Cardiovascular negative cardio ROS Normal cardiovascular exam Rhythm:Regular Rate:Normal     Neuro/Psych negative neurological ROS  negative psych ROS   GI/Hepatic negative GI ROS, Neg liver ROS,,,  Endo/Other  negative endocrine ROS    Renal/GU negative Renal ROS  negative genitourinary   Musculoskeletal negative musculoskeletal ROS (+)    Abdominal   Peds negative pediatric ROS (+)  Hematology negative hematology ROS (+)   Anesthesia Other Findings   Reproductive/Obstetrics negative OB ROS                              Anesthesia Physical Anesthesia Plan  ASA: 2  Anesthesia Plan: MAC   Post-op Pain Management:    Induction: Intravenous  PONV Risk Score and Plan:   Airway Management Planned:   Additional Equipment:   Intra-op Plan:   Post-operative Plan: Extubation in OR  Informed Consent: I have reviewed the patients History and Physical, chart, labs and discussed the procedure including the risks, benefits and alternatives for the proposed anesthesia with the patient or authorized representative who has indicated his/her understanding and acceptance.     Dental advisory given  Plan Discussed with: CRNA  Anesthesia Plan Comments:         Anesthesia Quick Evaluation  "

## 2024-10-11 NOTE — Anesthesia Postprocedure Evaluation (Signed)
"   Anesthesia Post Note  Patient: Briana Morris  Procedure(s) Performed: PHACOEMULSIFICATION, CATARACT, WITH IOL INSERTION 6.36, 00:36.7 (Left)  Patient location during evaluation: PACU Anesthesia Type: MAC Level of consciousness: awake and alert Pain management: pain level controlled Vital Signs Assessment: post-procedure vital signs reviewed and stable Respiratory status: spontaneous breathing, nonlabored ventilation, respiratory function stable and patient connected to nasal cannula oxygen Cardiovascular status: blood pressure returned to baseline and stable Postop Assessment: no apparent nausea or vomiting Anesthetic complications: no   No notable events documented.   Last Vitals:  Vitals:   10/11/24 0905 10/11/24 0907  BP: (!) 142/100 (!) 139/95  Pulse: 98 98  Resp: (!) 6 (!) 9  Temp:  (!) 36.2 C  SpO2: 94% 96%    Last Pain:  Vitals:   10/11/24 0907  TempSrc:   PainSc: 0-No pain                 Fairy A [name]      "

## 2024-10-11 NOTE — Transfer of Care (Signed)
 Immediate Anesthesia Transfer of Care Note  Patient: Briana Morris  Procedure(s) Performed: PHACOEMULSIFICATION, CATARACT, WITH IOL INSERTION 6.36, 00:36.7 (Left)  Patient Location: PACU  Anesthesia Type: MAC  Level of Consciousness: awake, alert  and patient cooperative  Airway and Oxygen Therapy: Patient Spontanous Breathing and Patient connected to supplemental oxygen  Post-op Assessment: Post-op Vital signs reviewed, Patient's Cardiovascular Status Stable, Respiratory Function Stable, Patent Airway and No signs of Nausea or vomiting  Post-op Vital Signs: Reviewed and stable  Complications: No notable events documented.

## 2024-10-11 NOTE — Op Note (Signed)
 PREOPERATIVE DIAGNOSIS:  Nuclear sclerotic cataract of the left eye.   POSTOPERATIVE DIAGNOSIS:  Nuclear sclerotic cataract of the left eye.   OPERATIVE PROCEDURE: Phacoemulsification with IOL Implant Left Eye   SURGEON:  Curtistine Fava, MD   ANESTHESIA:  Anesthesiologist: Maggioncalda, Fairy LABOR, MD CRNA: Pearl Ozell PARAS, CRNA  1.      Managed anesthesia care. 2.     0.44ml of epi-Shugarcaine was instilled following the paracentesis   COMPLICATIONS:  None.   TECHNIQUE:   Phacoemulsification divide and conquer   DESCRIPTION OF PROCEDURE:  The patient was examined and consented in the preoperative holding area where the aforementioned topical anesthesia was applied to the left eye and then brought back to the Operating Room where the left eye was prepped and draped in the usual sterile ophthalmic fashion and a lid speculum was placed. A paracentesis was created with the side port blade and the anterior chamber was filled with epi-Shugarcaine follower by viscoelastic. A clear corneal incision was performed with the steel keratome. A continuous curvilinear capsulorrhexis was performed with a cystotome followed by the capsulorrhexis forceps. Hydrodissection and hydrodelineation were carried out with BSS on a blunt cannula. The lens was removed in a divide and conquer technique and the remaining cortical material was removed with the irrigation-aspiration handpiece. The capsular bag was inflated with viscoelastic and the ZCB00+24.0D lens was placed in the capsular bag without complication. The remaining viscoelastic was removed from the eye with the irrigation-aspiration handpiece. The wounds were hydrated. The anterior chamber was flushed with BSS and the eye was inflated to physiologic pressure. 0.58ml of Vigamox  was placed in the anterior chamber. The wounds were found to be water tight. The eye was dressed with Combigan  and covered with a clear shield to be worn until the first postoperative day  appointment. The patient was given protective glasses to wear throughout the day. The patient was also given drops with which to begin a drop regimen today and will follow-up with me in one day. Implant Name Type Inv. Item Serial No. Manufacturer Lot No. LRB No. Used Action  LENS IOL TECNIS MONO 1P 24.0 - [id number] Intraocular Lens LENS IOL TECNIS MONO 1P 24.0 [id number] [employer]  Left 1 Implanted    Procedures: PHACOEMULSIFICATION, CATARACT, WITH IOL INSERTION 6.36, 00:36.7 (Left)  Electronically signed: Curtistine PARAS [facility] 10/11/2024 9:01 AM
